# Patient Record
Sex: Female | Born: 1990 | Race: White | Hispanic: No | Marital: Married | Smoking: Never smoker
Health system: Southern US, Community
[De-identification: ages and names within clinical notes are randomized; demographics above are authoritative.]

## PROBLEM LIST (undated history)

## (undated) DIAGNOSIS — J45990 Exercise induced bronchospasm: Secondary | ICD-10-CM

## (undated) HISTORY — DX: Exercise induced bronchospasm: J45.990

---

## 2016-12-11 ENCOUNTER — Encounter: Payer: Self-pay | Admitting: Medical

## 2016-12-11 ENCOUNTER — Ambulatory Visit: Payer: Self-pay | Admitting: Medical

## 2016-12-11 VITALS — BP 102/60 | HR 89 | Temp 99.2°F | Resp 16 | Ht 63.0 in | Wt 195.0 lb

## 2016-12-11 DIAGNOSIS — R059 Cough, unspecified: Secondary | ICD-10-CM

## 2016-12-11 DIAGNOSIS — J069 Acute upper respiratory infection, unspecified: Secondary | ICD-10-CM

## 2016-12-11 DIAGNOSIS — R05 Cough: Secondary | ICD-10-CM

## 2016-12-11 DIAGNOSIS — J0100 Acute maxillary sinusitis, unspecified: Secondary | ICD-10-CM

## 2016-12-11 MED ORDER — AZITHROMYCIN 250 MG PO TABS: ORAL_TABLET | ORAL | 0 refills | Status: DC

## 2016-12-11 MED ORDER — BENZONATATE 100 MG PO CAPS: 100.0000 mg | ORAL_CAPSULE | Freq: Three times a day (TID) | ORAL | 0 refills | Status: DC | PRN

## 2016-12-11 MED ORDER — ALBUTEROL SULFATE HFA 108 (90 BASE) MCG/ACT IN AERS: 2.0000 | INHALATION_SPRAY | Freq: Four times a day (QID) | RESPIRATORY_TRACT | 2 refills | Status: DC | PRN

## 2016-12-11 NOTE — Patient Instructions (Signed)
Upper respiratory infecation,  Sinusitis cough Azithromycin  250mg  two tablets  By mouth day 1, then one tablet days 2-5, take with food. Benzonatate 1-2 tablets by mouth every  8 hours as needed for cough.  Albuterol MDI  2 puff by mouth every 6 hours as needed for cough, shortness of breath or wheezing. Return to clinic in3 -5 days if not improving

## 2016-12-11 NOTE — Progress Notes (Signed)
   Subjective:    Patient ID: Kristin Edwards, Kristin Edwards    DOB: 10/08/1990, [age] y.o.   MRN: [id number]  HPI  26 yo Kristin Edwards with 10 days of not feeling well. Initially sore throat and then cough, productive dark yellow. Nasal congestion and discharge thats yellow in color .Sore throat better now. No fever or chills.   Review of Systems  Constitutional: Negative for chills, fatigue and fever.  HENT: Positive for congestion, postnasal drip, rhinorrhea and sinus pressure. Negative for ear pain, sinus pain and sore throat.   Eyes: Negative for discharge and itching.  Respiratory: Positive for shortness of breath and wheezing. Negative for cough.   Cardiovascular: Negative for chest pain.  Gastrointestinal: Negative for abdominal distention.  Endocrine: Negative for polydipsia, polyphagia and polyuria.  Genitourinary: Negative for hematuria.  Musculoskeletal: Negative for myalgias.  Skin: Negative for rash.  Allergic/Immunologic: Positive for environmental allergies. Negative for food allergies.  Neurological: Negative for syncope.  Hematological: Does not bruise/bleed easily.  Psychiatric/Behavioral: Negative for confusion and hallucinations.   Sore throat with coughing Cough noted in room    Objective:   Physical Exam  Constitutional: She is oriented to person, place, and time. Vital signs are normal. She appears well-developed and well-nourished.  HENT:  Head: Normocephalic and atraumatic.  Right Ear: Hearing, tympanic membrane, external ear and ear canal normal.  Left Ear: Hearing, tympanic membrane, external ear and ear canal normal.  Nose: Mucosal edema present.  Mouth/Throat: Uvula is midline, oropharynx is clear and moist and mucous membranes are normal.  Eyes: Conjunctivae and EOM are normal. Pupils are equal, round, and reactive to light.  Neck: Normal range of motion. Neck supple.  Cardiovascular: Normal rate, regular rhythm and normal heart sounds.  Exam reveals no gallop and  no friction rub.   No murmur heard. Musculoskeletal: Normal range of motion.  Neurological: She is alert and oriented to person, place, and time.  Skin: Skin is warm and dry.  Psychiatric: She has a normal mood and affect. Her behavior is normal.  Nursing note and vitals reviewed.         Assessment & Plan:  Upper respiratory infection,  Sinusitis,  Cough Azithromycin  250mg  two tablets take 2 tablets  by mouth day 1, then one tablet days 2-5, take with food.#6 no refills. Benzonatate 1-2 tablets by mouth every  8 hours as needed for cough. #30 no refills Albuterol MDI  2 puff by mouth every 6 hours as needed for cough, shortness of breath or wheezing. Return to clinic in 3 -5 days if not improving Patient denies pregnancy

## 2016-12-12 DIAGNOSIS — G43909 Migraine, unspecified, not intractable, without status migrainosus: Secondary | ICD-10-CM | POA: Insufficient documentation

## 2016-12-12 DIAGNOSIS — J45909 Unspecified asthma, uncomplicated: Secondary | ICD-10-CM | POA: Insufficient documentation

## 2016-12-19 ENCOUNTER — Ambulatory Visit (INDEPENDENT_AMBULATORY_CARE_PROVIDER_SITE_OTHER): Payer: BLUE CROSS/BLUE SHIELD | Admitting: Obstetrics and Gynecology

## 2016-12-19 ENCOUNTER — Encounter: Payer: Self-pay | Admitting: Obstetrics and Gynecology

## 2016-12-19 VITALS — BP 138/82 | HR 111 | Ht 65.0 in | Wt 192.4 lb

## 2016-12-19 DIAGNOSIS — Z01419 Encounter for gynecological examination (general) (routine) without abnormal findings: Secondary | ICD-10-CM | POA: Diagnosis not present

## 2016-12-19 DIAGNOSIS — E669 Obesity, unspecified: Secondary | ICD-10-CM

## 2016-12-19 DIAGNOSIS — Z30431 Encounter for routine checking of intrauterine contraceptive device: Secondary | ICD-10-CM

## 2016-12-19 MED ORDER — NORETHIN ACE-ETH ESTRAD-FE 1-20 MG-MCG(24) PO TABS: 1.0000 | ORAL_TABLET | Freq: Every day | ORAL | 6 refills | Status: DC

## 2016-12-19 NOTE — Patient Instructions (Signed)
Place annual gynecologic exam patient instructions here.

## 2016-12-19 NOTE — Progress Notes (Signed)
ANNUAL PREVENTATIVE CARE GYN  ENCOUNTER NOTE  Subjective:       Kristin Edwards is a 26 y.o. G0P0000 female here for a routine annual gynecologic exam.  Current complaints: 1.  Spotting a few days every 21-23 days for last year    Gynecologic History No LMP recorded. Patient is not currently having periods (Reason: IUD). Contraception: IUD placed 10/2013 Last Pap: 2017. Results were: normal   Obstetric History OB History  Gravida Para Term Preterm AB Living  0 0 0 0 0 0  SAB TAB Ectopic Multiple Live Births  0 0 0 0 0        Past Medical History:  Diagnosis Date  . Exercise-induced asthma     History reviewed. No pertinent surgical history.  Current Outpatient Prescriptions on File Prior to Visit  Medication Sig Dispense Refill  . albuterol (PROVENTIL HFA;VENTOLIN HFA) 108 (90 Base) MCG/ACT inhaler Inhale 2 puffs into the lungs every 6 (six) hours as needed for wheezing or shortness of breath. Please give ventolin 1 Inhaler 2  . levonorgestrel (MIRENA) 20 MCG/24HR IUD 20 Intra Uterine Devices by Intrauterine route once.    Marland Kitchen. azithromycin (ZITHROMAX) 250 MG tablet Take 2 tablet by mouth day 1 then one tablet days 2-5 , take with food. (Patient not taking: Reported on 12/19/2016) 6 tablet 0  . cetirizine (ZYRTEC) 10 MG tablet Take 10 mg by mouth as needed.     No current facility-administered medications on file prior to visit.     Allergies  Allergen Reactions  . Penicillins Hives    Social History   Social History  . Marital status: Single    Spouse name: N/A  . Number of children: N/A  . Years of education: N/A   Occupational History  . Not on file.   Social History Main Topics  . Smoking status: Never Smoker  . Smokeless tobacco: Never Used  . Alcohol use Yes     Comment: occas  . Drug use: No  . Sexual activity: Yes    Birth control/ protection: IUD     Comment: mirena   Other Topics Concern  . Not on file   Social History Narrative  . No  narrative on file    Family History  Problem Relation Age of Onset  . Heart attack Maternal Grandfather   . Breast cancer Paternal Grandmother     The following portions of the patient's history were reviewed and updated as appropriate: allergies, current medications, past family history, past medical history, past social history, past surgical history and problem list.  Review of Systems ROS Review of Systems - General ROS: negative for - chills, fatigue, fever, hot flashes, night sweats, weight gain or weight loss Psychological ROS: negative for - anxiety, decreased libido, depression, mood swings, physical abuse or sexual abuse Ophthalmic ROS: negative for - blurry vision, eye pain or loss of vision ENT ROS: negative for - headaches, hearing change, visual changes or vocal changes Allergy and Immunology ROS: negative for - hives, itchy/watery eyes or seasonal allergies Hematological and Lymphatic ROS: negative for - bleeding problems, bruising, swollen lymph nodes or weight loss Endocrine ROS: negative for - galactorrhea, hair pattern changes, hot flashes, malaise/lethargy, mood swings, palpitations, polydipsia/polyuria, skin changes, temperature intolerance or unexpected weight changes Breast ROS: negative for - new or changing breast lumps or nipple discharge Respiratory ROS: negative for - cough or shortness of breath Cardiovascular ROS: negative for - chest pain, irregular heartbeat, palpitations or shortness of breath  Gastrointestinal ROS: no abdominal pain, change in bowel habits, or black or bloody stools Genito-Urinary ROS: no dysuria, trouble voiding, or hematuria Musculoskeletal ROS: negative for - joint pain or joint stiffness Neurological ROS: negative for - bowel and bladder control changes Dermatological ROS: negative for rash and skin lesion changes   Objective:   BP 138/82   Pulse (!) 111   Ht 5\' 5"  (1.651 m)   Wt 192 lb 6.4 oz (87.3 kg)   BMI 32.02 kg/m   CONSTITUTIONAL: Well-developed, well-nourished female in no acute distress.  PSYCHIATRIC: Normal mood and affect. Normal behavior. Normal judgment and thought content. NEUROLGIC: Alert and oriented to person, place, and time. Normal muscle tone coordination. No cranial nerve deficit noted. HENT:  Normocephalic, atraumatic, External right and left ear normal. Oropharynx is clear and moist EYES: Conjunctivae and EOM are normal. Pupils are equal, round, and reactive to light. No scleral icterus.  NECK: Normal range of motion, supple, no masses.  Normal thyroid.  SKIN: Skin is warm and dry. No rash noted. Not diaphoretic. No erythema. No pallor. CARDIOVASCULAR: Normal heart rate noted, regular rhythm, no murmur. RESPIRATORY: Clear to auscultation bilaterally. Effort and breath sounds normal, no problems with respiration noted. BREASTS: Symmetric in size. No masses, skin changes, nipple drainage, or lymphadenopathy. ABDOMEN: Soft, normal bowel sounds, no distention noted.  No tenderness, rebound or guarding.  BLADDER: Normal PELVIC:  External Genitalia: Normal  BUS: Normal  Vagina: Normal  Cervix: Normal  Uterus: Normal  Adnexa: Normal  RV: deferred  MUSCULOSKELETAL: Normal range of motion. No tenderness.  No cyanosis, clubbing, or edema.  2+ distal pulses. LYMPHATIC: No Axillary, Supraclavicular, or Inguinal Adenopathy.    Assessment:   Annual gynecologic examination 26 y.o. Contraception: IUD Obesity 1 Problem List Items Addressed This Visit    None    Visit Diagnoses    Encounter for gynecological examination without abnormal finding    -  Primary      Plan:  Pap: Not needed Mammogram: Not Indicated Stool Guaiac Testing:  Not Indicated Labs: comprehensive metabolic, Lipid 1, TSH and Hemoglobin A1C Routine preventative health maintenance measures emphasized: Exercise/Diet/Weight control and Stress Management  Return to Clinic - 1 Year   Melody Suzan Nailer, CNM

## 2016-12-20 LAB — COMPREHENSIVE METABOLIC PANEL
A/G RATIO: 1.5 (ref 1.2–2.2)
ALBUMIN: 4.6 g/dL (ref 3.5–5.5)
ALT: 17 IU/L (ref 0–32)
AST: 23 IU/L (ref 0–40)
Alkaline Phosphatase: 65 IU/L (ref 39–117)
BILIRUBIN TOTAL: 0.3 mg/dL (ref 0.0–1.2)
BUN / CREAT RATIO: 18 (ref 9–23)
BUN: 13 mg/dL (ref 6–20)
CHLORIDE: 104 mmol/L (ref 96–106)
CO2: 23 mmol/L (ref 20–29)
Calcium: 9.5 mg/dL (ref 8.7–10.2)
Creatinine, Ser: 0.73 mg/dL (ref 0.57–1.00)
GFR calc Af Amer: 131 mL/min/{1.73_m2} (ref >59)
GFR calc non Af Amer: 114 mL/min/{1.73_m2} (ref >59)
Globulin, Total: 3.1 g/dL (ref 1.5–4.5)
Glucose: 84 mg/dL (ref 65–99)
POTASSIUM: 4.5 mmol/L (ref 3.5–5.2)
Sodium: 144 mmol/L (ref 134–144)
Total Protein: 7.7 g/dL (ref 6.0–8.5)

## 2016-12-20 LAB — TSH: TSH: 3.5 u[IU]/mL (ref 0.450–4.500)

## 2016-12-20 LAB — LIPID PANEL
CHOLESTEROL TOTAL: 148 mg/dL (ref 100–199)
Chol/HDL Ratio: 3.3 ratio (ref 0.0–4.4)
HDL: 45 mg/dL (ref >39)
LDL CALC: 91 mg/dL (ref 0–99)
Triglycerides: 60 mg/dL (ref 0–149)
VLDL CHOLESTEROL CAL: 12 mg/dL (ref 5–40)

## 2016-12-20 LAB — HEMOGLOBIN A1C
Est. average glucose Bld gHb Est-mCnc: 94 mg/dL
Hgb A1c MFr Bld: 4.9 % (ref 4.8–5.6)

## 2017-11-05 ENCOUNTER — Ambulatory Visit
Admission: RE | Admit: 2017-11-05 | Discharge: 2017-11-05 | Disposition: A | Discharge status: Home / Self Care | Payer: BLUE CROSS/BLUE SHIELD | Source: Ambulatory Visit | Attending: Adult Health | Admitting: Adult Health

## 2017-11-05 ENCOUNTER — Other Ambulatory Visit: Admission: RE | Admit: 2017-11-05 | Payer: BLUE CROSS/BLUE SHIELD | Source: Ambulatory Visit | Admitting: *Deleted

## 2017-11-05 ENCOUNTER — Ambulatory Visit: Payer: Self-pay | Admitting: Adult Health

## 2017-11-05 VITALS — BP 127/83 | HR 101 | Temp 99.1°F | Resp 16 | Ht 66.0 in | Wt 206.4 lb

## 2017-11-05 DIAGNOSIS — Z0000 Encounter for general adult medical examination without abnormal findings: Secondary | ICD-10-CM

## 2017-11-05 DIAGNOSIS — M79662 Pain in left lower leg: Secondary | ICD-10-CM

## 2017-11-05 NOTE — Progress Notes (Addendum)
Subjective:     Patient ID: Kristin Edwards, female   DOB: 03/23/1991, 27 y.o.   MRN: [id number]  HPI   Blood pressure 127/83, pulse (!) 101, temperature 99.1 F (37.3 C), temperature source Tympanic, resp. rate 16, height  (1.676 m), weight 206 lb 6.4 oz (93.6 kg), SpO2 100 %.  Patient is a 27 year old female in no acute distress who comes in with complaints of left mid to lower  shin " bump " area   - she reports she noticed this area while she was watching tv. She reports previous shin splints in past to left and right lower leg. Onset unknown.  She reports many sports injuries - such as ankle sprain and left foot break in past. She also moved on April 1st and also remebers bumping herself many times but no significant to that area.   Denies any other areas of concerns. Also denies any gait abnormality.Denies any recent trauma or injury.   She reports she has recently started an intense work out routine in the past three weeks including many push ups and burpee- she noticed this area in her left lower leg after starting this exercise routine.  Tender if she " pushes" on area and  Denies any pain otherwise.    Denies any skin cancer history personal or family.  Denies any cancer history in family.  She denied any chronic medical problems and reports she is using only Flonase daily.  Review of Systems  Constitutional: Negative.   HENT: Negative.   Respiratory: Negative.   Cardiovascular: Negative.   Gastrointestinal: Negative.   Endocrine: Negative.   Genitourinary: Negative.   Musculoskeletal:       Left lower leg " BUMPY " area   Allergic/Immunologic: Negative.   Neurological: Negative.   Hematological: Negative.   Psychiatric/Behavioral: Negative.        Objective:   Physical Exam  Constitutional: She is oriented to person, place, and time. She appears well-developed and well-nourished. No distress.  HENT:  Head: Normocephalic and atraumatic.  Nose: Nose normal.    Mouth/Throat: No oropharyngeal exudate.  Eyes: Pupils are equal, round, and reactive to light. Conjunctivae and EOM are normal. Right eye exhibits no discharge. Left eye exhibits no discharge. No scleral icterus.  Neck: Normal range of motion. Neck supple.  Cardiovascular: Normal rate, regular rhythm, normal heart sounds and intact distal pulses. Exam reveals no gallop and no friction rub.  No murmur heard. Pulmonary/Chest: Effort normal and breath sounds normal. No stridor. No respiratory distress. She has no wheezes. She exhibits no tenderness.  Abdominal: Soft. Bowel sounds are normal. She exhibits no distension and no mass. There is no tenderness. There is no rebound and no guarding. No hernia.  Musculoskeletal: Normal range of motion.       Left knee: She exhibits normal range of motion, no swelling, no effusion, no ecchymosis, no deformity, no laceration, no erythema, normal alignment, no LCL laxity, normal patellar mobility, no bony tenderness, normal meniscus and no MCL laxity. Tenderness: tenderness - " very mild" with deep  palpation over area marked / no significant abnormality of anatomy or skin noted on exam  No medial joint line, no lateral joint line, no MCL, no LCL and no patellar tendon tenderness noted.       Legs:      Right foot: There is normal range of motion and no deformity.       Left foot: There is normal range of motion and  no deformity.  Feet:  Right Foot:  Skin Integrity: Negative for ulcer, blister, skin breakdown, erythema, warmth, callus or dry skin.  Left Foot:  Skin Integrity: Negative for ulcer, blister, skin breakdown, erythema, warmth, callus or dry skin.  Lymphadenopathy:       Head (right side): No submental, no submandibular, no tonsillar, no preauricular, no posterior auricular and no occipital adenopathy present.       Head (left side): No submental, no submandibular, no tonsillar, no preauricular, no posterior auricular and no occipital adenopathy  present.       Right cervical: No superficial cervical, no deep cervical and no posterior cervical adenopathy present.      Left cervical: No superficial cervical, no deep cervical and no posterior cervical adenopathy present.  Neurological: She is alert and oriented to person, place, and time. She has normal strength and normal reflexes.  Skin: Skin is warm and dry. Capillary refill takes less than 2 seconds. No abrasion, no bruising, no burn, no ecchymosis, no laceration, no lesion, no petechiae and no rash noted. She is not diaphoretic. No cyanosis or erythema. No pallor. Nails show no clubbing.  Psychiatric: She has a normal mood and affect. Her speech is normal and behavior is normal. Thought content normal.  Nursing note and vitals reviewed.      Assessment:     Routine adult health maintenance - Plan: CBC w/Diff, DG Tibia/Fibula Left  Pain in left shin - Plan: DG Tibia/Fibula Left      Plan:     Motrin 600 mg every eight hours as needed for pain and inflammation x 6 days.   Will x ray left lower leg given previous injuries and new work out routine.  Will also check CBC as she has not had done with labs from 2018.  Orders Placed This Encounter  Procedures  . DG Tibia/Fibula Left    Standing Status:   Future    Standing Expiration Date:   02/05/2018    Order Specific Question:   Reason for Exam (SYMPTOM  OR DIAGNOSIS REQUIRED)    Answer:   shin pain / " knotty feeling" / multiple previous sports injuries    Order Specific Question:   Is patient pregnant?    Answer:   No    Order Specific Question:   Preferred imaging location?    Answer:   [facility]    Order Specific Question:   Call Results- Best Contact Number?    Answer:   [phone]    Order Specific Question:   Radiology Contrast Protocol - do NOT remove file path    Answer:   [email or web address]  . CBC w/Diff   Follow up in two weeks. Will call with x ray result and labs   if no call within one week patient will call.   Advised patient call the office or your primary care doctor for an appointment if no improvement within 72 hours or if any symptoms change or worsen at any time  Advised ER or urgent Care if after hours or on weekend. Call 911 for emergency symptoms at any time.Patinet verbalized understanding of all instructions given/reviewed and treatment plan and has no further questions or concerns at this time.

## 2017-11-05 NOTE — Patient Instructions (Addendum)
Shin Splints Rehab Ask your health care provider which exercises are safe for you. Do exercises exactly as told by your health care provider and adjust them as directed. It is normal to feel mild stretching, pulling, tightness, or discomfort as you do these exercises, but you should stop right away if you feel sudden pain or your pain gets worse.Do not begin these exercises until told by your health care provider. Stretching and range of motion exercise This exercise warms up your muscles and joints and improves the movement and flexibility of your lower leg. This exercise also helps to relieve pain, numbness, and tingling. Exercise A: Calf stretch, standing  1. Stand with the ball of your left / right foot on a step. The ball of your foot is on the walking surface, right under your toes. 2. Keep your other foot firmly on the same step. 3. Hold onto the wall, a railing, or a chair for balance. 4. Slowly lift your other foot, allowing your body weight to press your left / right heel down over the edge of the step. You should feel a stretch in your left / right calf. 5. Hold this position for __________ seconds. 6. Repeat this exercise with a slight bend in your left / right knee. Repeat __________ times with your left / right knee straight and __________ times with your left / right knee bent. Complete this exercise __________ times a day. Strengthening exercises These exercises build strength and endurance in your lower leg. Endurance is the ability to use your muscles for a long time, even after they get tired. Exercise B: Dorsiflexion  1. Secure a rubber exercise band or tubing to a fixed object, such as a table leg or a pole. 2. Secure the other end of the band around your left / right foot. 3. Sit on the floor, facing the fixed object. The band should be slightly tense when your foot is relaxed. 4. Slowly use your ankle muscles to pull your foot toward you. 5. Hold this position for  __________ seconds. 6. Slowly release the tension in the band and return your foot to the starting position. Repeat __________ times. Complete this exercise __________ times a day. Exercise C: Ankle eversion with band 1. Secure one end of a rubber exercise band or tubing to a fixed object, such as a table leg or a pole, that will stay in place when the band is pulled. 2. Loop the other end of the band around the middle of your left / right foot. 3. Sit on the floor, facing the fixed object. The band should be slightly tense when your foot is relaxed. 4. Make fists with your hands and put them between your knees. This will focus your strengthening at your ankle. 5. Leading with your little toe, slowly push your banded foot outward, away from your other leg. Make sure the band is positioned to resist the entire motion. 6. Hold this position for __________ seconds. 7. Control the tension in the band as you slowly return your foot to the starting position. Repeat __________ times. Complete this exercise __________ times a day. Exercise D: Ankle inversion with band 1. Secure one end of a rubber exercise band or tubing to a fixed object, such as a table leg or a pole, that will stay still when the band is pulled. 2. Loop the other end of the band around your left / right foot, just below your toes. 3. Sit on the floor, facing the fixed  object. The band should be slightly tense when your foot is relaxed. 4. Make fists with your hands and put them between your knees. This will focus your strengthening at your ankle. 5. Leading with your big toe, slowly pull your banded foot inward, toward your other leg. Make sure the band is positioned to resist the entire motion. 6. Hold this position for __________ seconds. 7. Control the tension in the band as you slowly return your foot to the starting position. Repeat __________ times. Complete this exercise __________ times a day. Exercise E: Lateral walking with  band 1. Stand in a long hallway. 2. Wrap a loop of exercise band around your legs, just above your knees. 3. Bend your knees gently and drop your hips down and back so your weight is over your heels. 4. Step to the side to move down the length of the hallway, keeping your toes pointed forward and keeping tension in the band. 5. Repeat, leading with your other leg. Repeat __________ times. Complete this exercise __________ times a day. Balance exercise This exercise will help improve your control of your foot and ankle when you are standing or walking. Exercise F: Single leg stand 1. Without wearing shoes, stand near a railing or in a doorway. You may hold onto the railing or door frame as needed. 2. Stand on your left / right foot. Keep your big toe down on the floor and try to keep your arch lifted. 3. If this exercise is too easy, you can try doing it with your eyes closed or while standing on a pillow. 4. Hold this position for __________ seconds. Repeat __________ times. Complete this exercise __________ times a day. This information is not intended to replace advice given to you by your health care provider. Make sure you discuss any questions you have with your health care provider. Document Released: 06/19/2005 Document Revised: 02/22/2016 Document Reviewed: 03/18/2015 Elsevier Interactive Patient Education  [employer].

## 2017-11-06 ENCOUNTER — Telehealth: Payer: Self-pay | Admitting: Adult Health

## 2017-11-06 ENCOUNTER — Encounter: Payer: Self-pay | Admitting: Adult Health

## 2017-11-06 DIAGNOSIS — Z0000 Encounter for general adult medical examination without abnormal findings: Secondary | ICD-10-CM

## 2017-11-06 LAB — CBC WITH DIFFERENTIAL/PLATELET
BASOS: 0 %
Basophils Absolute: 0 10*3/uL (ref 0.0–0.2)
EOS (ABSOLUTE): 0.1 10*3/uL (ref 0.0–0.4)
Eos: 1 %
Hematocrit: 43.1 % (ref 34.0–46.6)
Hemoglobin: 14.2 g/dL (ref 11.1–15.9)
IMMATURE GRANS (ABS): 0 10*3/uL (ref 0.0–0.1)
IMMATURE GRANULOCYTES: 0 %
LYMPHS: 27 %
Lymphocytes Absolute: 1.9 10*3/uL (ref 0.7–3.1)
MCH: 30.1 pg (ref 26.6–33.0)
MCHC: 32.9 g/dL (ref 31.5–35.7)
MCV: 92 fL (ref 79–97)
Monocytes Absolute: 0.5 10*3/uL (ref 0.1–0.9)
Monocytes: 7 %
NEUTROS PCT: 65 %
Neutrophils Absolute: 4.6 10*3/uL (ref 1.4–7.0)
PLATELETS: 421 10*3/uL — AB (ref 150–379)
RBC: 4.71 x10E6/uL (ref 3.77–5.28)
RDW: 13.2 % (ref 12.3–15.4)
WBC: 7.1 10*3/uL (ref 3.4–10.8)

## 2017-11-06 NOTE — Progress Notes (Signed)
Reviewed normal x ray left tibial/ fibula with patient.

## 2017-11-06 NOTE — Telephone Encounter (Signed)
Patient was called 11/06/17 10:15am  with lab results as below- Platelets mildly  Elevated, patient is informed she will need repeat lab draw to recheck platelets in one month and a follow up appointment in 1- 2 weeks for recheck on leg. She reports her leg is a little more painful  Today- though she reports she did a intense workout last night. She is advised again to rest leg for one to two weeks and not manipulate the area multiple times with hand. She will do Apply a compressive ACE bandage. Rest and elevate the affected painful area.  Apply cold compresses intermittently as needed.  As pain recedes, begin normal activities slowly as tolerated.  Call if symptoms persist. She is also advised of [facility] in [place]- walk in clinic hours Monday to Friday - 1 pm to 7 pm. She is advised she may go for evaluation there if after hours or she feels symptoms worsening. Continue care plan from office visit, avoid exercise at this time and follow up evaluation 1-2 weeks.  Orders Placed This Encounter  Procedures  . CBC w/Diff    Standing Status:   Future    Standing Expiration Date:   01/06/2018  . Comprehensive metabolic panel    Standing Status:   Future    Standing Expiration Date:   01/06/2018  I have already ordered this as a future order in the computer.  Advised patient call the office or your primary care doctor for an appointment if no improvement within 72 hours or if any symptoms change or worsen at any time  Advised ER or urgent Care if after hours or on weekend. Call 911 for emergency symptoms at any time.Patinet verbalized understanding of all instructions given/reviewed and treatment plan and has no further questions or concerns at this time.    Patient verbalized understanding of all instructions given and denies any further questions at this time.    Results for orders placed or performed in visit on 11/05/17 (from the past 24 hour(s))  CBC w/Diff     Status: Abnormal   Collection  Time: 11/05/17 10:13 AM  Result Value Ref Range   WBC 7.1 3.4 - 10.8 x10E3/uL   RBC 4.71 3.77 - 5.28 x10E6/uL   Hemoglobin 14.2 11.1 - 15.9 g/dL   Hematocrit 16.1 09.6 - 46.6 %   MCV 92 79 - 97 fL   MCH 30.1 26.6 - 33.0 pg   MCHC 32.9 31.5 - 35.7 g/dL   RDW 04.5 40.9 - 81.1 %   Platelets 421 (H) 150 - 379 x10E3/uL   Neutrophils 65 Not Estab. %   Lymphs 27 Not Estab. %   Monocytes 7 Not Estab. %   Eos 1 Not Estab. %   Basos 0 Not Estab. %   Neutrophils Absolute 4.6 1.4 - 7.0 x10E3/uL   Lymphocytes Absolute 1.9 0.7 - 3.1 x10E3/uL   Monocytes Absolute 0.5 0.1 - 0.9 x10E3/uL   EOS (ABSOLUTE) 0.1 0.0 - 0.4 x10E3/uL   Basophils Absolute 0.0 0.0 - 0.2 x10E3/uL   Immature Granulocytes 0 Not Estab. %   Immature Grans (Abs) 0.0 0.0 - 0.1 x10E3/uL   Narrative   Performed at:  [address], [place], [place]  [id number] Lab Director: Jolene Schimke MD, Phone:  [phone]   CLINICAL DATA:  The patient noticed a bulla over the lower lobe left leg. History of occasional pain here. Duration of the finding several months. History of previous sports injuries.  EXAM:  LEFT TIBIA AND FIBULA - 2 VIEW  COMPARISON:  None in PACs  FINDINGS: The site of the cutaneous finding is not noted on the images. The tibia and fibula are subjectively adequately mineralized and are intact. The observed portions of the knee and ankle are normal. There is no lytic or blastic bony lesion or periosteal reaction. The pretibial soft tissues are fairly uniform in thickness. No discrete soft tissue mass is observed. The soft tissues elsewhere exhibit no suspicious findings.  IMPRESSION: No radiographic abnormalities are observed.   Electronically Signed   By: David  [place] M.D.   On: 11/05/2017 15:57

## 2017-11-13 ENCOUNTER — Other Ambulatory Visit: Payer: Self-pay | Admitting: Obstetrics and Gynecology

## 2017-11-20 ENCOUNTER — Encounter: Payer: Self-pay | Admitting: Medical

## 2017-11-20 ENCOUNTER — Ambulatory Visit: Payer: Self-pay | Admitting: Medical

## 2017-11-20 VITALS — BP 136/73 | HR 77 | Temp 99.7°F | Wt 207.8 lb

## 2017-11-20 DIAGNOSIS — M7989 Other specified soft tissue disorders: Secondary | ICD-10-CM

## 2017-11-20 NOTE — Progress Notes (Signed)
   Subjective:    Patient ID: Kristin Edwards, female    DOB: Oct 28, 1990, 27 y.o.   MRN: [id number]  HPI 27 yo female  non acute distess. Noticed that she has had this bump  it  5-6 years ago in  old photographs.  She states she was all "freaked out" by the internet and her grandmother and mother about the area and thought she may have bone cancer.  She did soccer and competitive tennis  Iin college.She also started a five weeks ago a new training program called.[employer] bikini body exercise program. The  leg does not hurt. She had an xray of the area and blood work on 11/05/17 and saw Marvell Fuller NP in the clinic.She was advised to take  Advil 600 mg every 8 hours but she forgot more then she remembered because it really did not hurt. She also  Iced and ace wrapped faithfully, then wore a compression sock, both did nothing for the swelling..  She states she misses exercise and would like to return to her routine..  Review of Systems  Constitutional: Negative for chills and fever.  HENT: Positive for postnasal drip. Negative for congestion and sore throat.   Eyes: Negative for discharge and itching.  Respiratory: Positive for cough (phelgm in the morning).   Cardiovascular: Negative for chest pain.  Gastrointestinal: Negative for abdominal pain.  Endocrine: Negative for polydipsia, polyphagia and polyuria.  Genitourinary: Negative for dysuria.  Musculoskeletal: Negative for myalgias.  Skin: Negative for rash.  Allergic/Immunologic: Positive for environmental allergies. Negative for food allergies.  Neurological: Positive for headaches. Negative for dizziness and light-headedness.  Hematological: Negative for adenopathy.  Psychiatric/Behavioral: Negative for behavioral problems, self-injury and suicidal ideas.       Objective:   Physical Exam  Constitutional: She is oriented to person, place, and time. She appears well-developed and well-nourished.  HENT:  Head: Normocephalic and  atraumatic.  Eyes: Pupils are equal, round, and reactive to light. Conjunctivae and EOM are normal.  Musculoskeletal: She exhibits edema (left lower anterior leg).  Neurological: She is alert and oriented to person, place, and time.  Skin: Skin is warm and dry. Capillary refill takes less than 2 seconds. No rash noted. No erythema. No pallor.  Psychiatric: She has a normal mood and affect. Her behavior is normal. Judgment and thought content normal.  Nursing note and vitals reviewed.       Noted area on  Left lower anterior leg on palpation approximately 7 cm x  4 cm, non tender . Skin tissue looks healthy and pink  <2 s cr.    Assessment & Plan:   Swelling on left lower anterior leg. Could be old scar tissue. May want to refer her to orthopedics if not improved in 2 months.  Elevated platelets Reviewed with patient. CBC with platelet recheck  1-2 months. recheck in 2 months of leg swelling. per Dr. Sullivan Lone with family doctor or here. Reviewed plan with Marvell Fuller NP and she was in agreement. She says she is travel to [place] in June for 11 days, and that she will come by for blood work after she returns. She verbalizes understanding and has no questions at discharge.

## 2017-12-25 ENCOUNTER — Ambulatory Visit (INDEPENDENT_AMBULATORY_CARE_PROVIDER_SITE_OTHER): Payer: BLUE CROSS/BLUE SHIELD | Admitting: Certified Nurse Midwife

## 2017-12-25 ENCOUNTER — Encounter: Payer: BLUE CROSS/BLUE SHIELD | Admitting: Obstetrics and Gynecology

## 2017-12-25 VITALS — BP 136/75 | HR 83 | Ht 66.0 in | Wt 211.2 lb

## 2017-12-25 DIAGNOSIS — Z0000 Encounter for general adult medical examination without abnormal findings: Secondary | ICD-10-CM

## 2017-12-25 NOTE — Patient Instructions (Signed)
Preventive Care 18-39 Years, Female Preventive care refers to lifestyle choices and visits with your health care provider that can promote health and wellness. What does preventive care include?  A yearly physical exam. This is also called an annual well check.  Dental exams once or twice a year.  Routine eye exams. Ask your health care provider how often you should have your eyes checked.  Personal lifestyle choices, including: ? Daily care of your teeth and gums. ? Regular physical activity. ? Eating a healthy diet. ? Avoiding tobacco and drug use. ? Limiting alcohol use. ? Practicing safe sex. ? Taking vitamin and mineral supplements as recommended by your health care provider. What happens during an annual well check? The services and screenings done by your health care provider during your annual well check will depend on your age, overall health, lifestyle risk factors, and family history of disease. Counseling Your health care provider may ask you questions about your:  Alcohol use.  Tobacco use.  Drug use.  Emotional well-being.  Home and relationship well-being.  Sexual activity.  Eating habits.  Work and work Statistician.  Method of birth control.  Menstrual cycle.  Pregnancy history.  Screening You may have the following tests or measurements:  Height, weight, and BMI.  Diabetes screening. This is done by checking your blood sugar (glucose) after you have not eaten for a while (fasting).  Blood pressure.  Lipid and cholesterol levels. These may be checked every 5 years starting at age 66.  Skin check.  Hepatitis C blood test.  Hepatitis B blood test.  Sexually transmitted disease (STD) testing.  BRCA-related cancer screening. This may be done if you have a family history of breast, ovarian, tubal, or peritoneal cancers.  Pelvic exam and Pap test. This may be done every 3 years starting at age 40. Starting at age 59, this may be done every 5  years if you have a Pap test in combination with an HPV test.  Discuss your test results, treatment options, and if necessary, the need for more tests with your health care provider. Vaccines Your health care provider may recommend certain vaccines, such as:  Influenza vaccine. This is recommended every year.  Tetanus, diphtheria, and acellular pertussis (Tdap, Td) vaccine. You may need a Td booster every 10 years.  Varicella vaccine. You may need this if you have not been vaccinated.  HPV vaccine. If you are 27 or younger, you may need three doses over 6 months.  Measles, mumps, and rubella (MMR) vaccine. You may need at least one dose of MMR. You may also need a second dose.  Pneumococcal 13-valent conjugate (PCV13) vaccine. You may need this if you have certain conditions and were not previously vaccinated.  Pneumococcal polysaccharide (PPSV23) vaccine. You may need one or two doses if you smoke cigarettes or if you have certain conditions.  Meningococcal vaccine. One dose is recommended if you are age 27-27 years and a first-year college student living in a residence hall, or if you have one of several medical conditions. You may also need additional booster doses.  Hepatitis A vaccine. You may need this if you have certain conditions or if you travel or work in places where you may be exposed to hepatitis A.  Hepatitis B vaccine. You may need this if you have certain conditions or if you travel or work in places where you may be exposed to hepatitis B.  Haemophilus influenzae type b (Hib) vaccine. You may need this if  you have certain risk factors.  Talk to your health care provider about which screenings and vaccines you need and how often you need them. This information is not intended to replace advice given to you by your health care provider. Make sure you discuss any questions you have with your health care provider. Document Released: 08/15/2001 Document Revised: 03/08/2016  Document Reviewed: 04/20/2015 Elsevier Interactive Patient Education  [employer].

## 2017-12-25 NOTE — Progress Notes (Signed)
GYNECOLOGY ANNUAL PREVENTATIVE CARE ENCOUNTER NOTE  Subjective:   Kristin Edwards is a 27 y.o. G0P0000 female here for a routine annual gynecologic exam.  Current complaints: none.   Denies abnormal vaginal bleeding, discharge, pelvic pain, problems with intercourse or other gynecologic concerns.    Gynecologic History No LMP recorded. (Menstrual status: IUD). Contraception: IUD Last Pap: 2017 per pt Results were: normal  Last mammogram: N/A    Obstetric History OB History  Gravida Para Term Preterm AB Living  0 0 0 0 0 0  SAB TAB Ectopic Multiple Live Births  0 0 0 0 0    Past Medical History:  Diagnosis Date  . Exercise-induced asthma     No past surgical history on file.  Current Outpatient Medications on File Prior to Visit  Medication Sig Dispense Refill  . albuterol (PROVENTIL HFA;VENTOLIN HFA) 108 (90 Base) MCG/ACT inhaler Inhale 2 puffs into the lungs every 6 (six) hours as needed for wheezing or shortness of breath. Please give ventolin 1 Inhaler 2  . cetirizine (ZYRTEC) 10 MG tablet Take 10 mg by mouth as needed.    Marland Kitchen levonorgestrel (MIRENA) 20 MCG/24HR IUD 20 Intra Uterine Devices by Intrauterine route once.     No current facility-administered medications on file prior to visit.     Allergies  Allergen Reactions  . Penicillins Hives    Social History:  reports that she has never smoked. She has never used smokeless tobacco. She reports that she drinks alcohol. She reports that she does not use drugs.  Family History  Problem Relation Age of Onset  . Heart attack Maternal Grandfather   . Breast cancer Paternal Grandmother     The following portions of the patient's history were reviewed and updated as appropriate: allergies, current medications, past family history, past medical history, past social history, past surgical history and problem list.  Review of Systems Pertinent items noted in HPI and remainder of comprehensive ROS otherwise negative.    Objective:  There were no vitals taken for this visit. CONSTITUTIONAL: Well-developed, well-nourished obese female in no acute distress.  HENT:  Normocephalic, atraumatic, External right and left ear normal. Oropharynx is clear and moist EYES: Conjunctivae and EOM are normal. Pupils are equal, round, and reactive to light. No scleral icterus.  NECK: Normal range of motion, supple, no masses.  Normal thyroid.  SKIN: Skin is warm and dry. No rash noted. Not diaphoretic. No erythema. No pallor. MUSCULOSKELETAL: Normal range of motion. No tenderness.  No cyanosis, clubbing, or edema.  2+ distal pulses. NEUROLOGIC: Alert and oriented to person, place, and time. Normal reflexes, muscle tone coordination. No cranial nerve deficit noted. PSYCHIATRIC: Normal mood and affect. Normal behavior. Normal judgment and thought content. CARDIOVASCULAR: Normal heart rate noted, regular rhythm RESPIRATORY: Clear to auscultation bilaterally. Effort and breath sounds normal, no problems with respiration noted. BREASTS: Symmetric in size. No masses, skin changes, nipple drainage, or lymphadenopathy. ABDOMEN: Soft, normal bowel sounds, no distention noted.  No tenderness, rebound or guarding.  PELVIC: Normal appearing external genitalia; normal appearing vaginal mucosa and cervix.  No abnormal discharge noted.    Normal uterine size, no other palpable masses, no uterine or adnexal tenderness.    Assessment and Plan:  Annual Well Women Exam  Labs: TSH, CMP, Lipid panel, TSH done last year. No labs indicated. Pt request CBC she state she has a lump on her leg that she had for a while . She went to MD @ [facility] and had work  up . She a CBC that showed elevated plts. She was to have it repeated and asked if she could have it done here. Order placed.   Mammogram not indicated Routine preventative health maintenance measures emphasized. Please refer to After Visit Summary for other counseling recommendations.   Doreene BurkeAnnie  Earsel Shouse, CNM

## 2017-12-25 NOTE — Progress Notes (Signed)
Pt is here for an annual exam. Pt has a Mirena inserted 07/24/13.

## 2017-12-26 ENCOUNTER — Encounter: Payer: Self-pay | Admitting: Medical

## 2017-12-26 ENCOUNTER — Encounter (INDEPENDENT_AMBULATORY_CARE_PROVIDER_SITE_OTHER): Payer: Self-pay

## 2017-12-26 LAB — CBC
Hematocrit: 40.1 % (ref 34.0–46.6)
Hemoglobin: 13.6 g/dL (ref 11.1–15.9)
MCH: 29.8 pg (ref 26.6–33.0)
MCHC: 33.9 g/dL (ref 31.5–35.7)
MCV: 88 fL (ref 79–97)
PLATELETS: 421 10*3/uL (ref 150–450)
RBC: 4.57 x10E6/uL (ref 3.77–5.28)
RDW: 13.6 % (ref 12.3–15.4)
WBC: 8.7 10*3/uL (ref 3.4–10.8)

## 2018-01-02 ENCOUNTER — Ambulatory Visit: Payer: BLUE CROSS/BLUE SHIELD | Admitting: Obstetrics and Gynecology

## 2018-01-14 ENCOUNTER — Telehealth: Payer: Self-pay | Admitting: Obstetrics and Gynecology

## 2018-01-14 NOTE — Telephone Encounter (Signed)
The patient called and stated that she would like to speak with a nurse in regards to wanting to know how much Advil she can take before her appointment tomorrow. No other information was disclosed.  Please advise.

## 2018-01-14 NOTE — Telephone Encounter (Signed)
Notified pt. 

## 2018-01-15 ENCOUNTER — Other Ambulatory Visit (INDEPENDENT_AMBULATORY_CARE_PROVIDER_SITE_OTHER): Payer: BLUE CROSS/BLUE SHIELD

## 2018-01-15 ENCOUNTER — Encounter: Payer: Self-pay | Admitting: Obstetrics and Gynecology

## 2018-01-15 ENCOUNTER — Ambulatory Visit (INDEPENDENT_AMBULATORY_CARE_PROVIDER_SITE_OTHER): Payer: BLUE CROSS/BLUE SHIELD | Admitting: Obstetrics and Gynecology

## 2018-01-15 ENCOUNTER — Telehealth: Payer: Self-pay | Admitting: Obstetrics and Gynecology

## 2018-01-15 ENCOUNTER — Other Ambulatory Visit: Payer: Self-pay | Admitting: Obstetrics and Gynecology

## 2018-01-15 VITALS — BP 150/86 | HR 100 | Ht 66.0 in | Wt 210.9 lb

## 2018-01-15 DIAGNOSIS — Z30433 Encounter for removal and reinsertion of intrauterine contraceptive device: Secondary | ICD-10-CM

## 2018-01-15 DIAGNOSIS — Z30431 Encounter for routine checking of intrauterine contraceptive device: Secondary | ICD-10-CM

## 2018-01-15 MED ORDER — LEVOFLOXACIN 500 MG PO TABS: 500.0000 mg | ORAL_TABLET | Freq: Every day | ORAL | 0 refills | Status: DC

## 2018-01-15 MED ORDER — FLUCONAZOLE 150 MG PO TABS: 150.0000 mg | ORAL_TABLET | Freq: Once | ORAL | 3 refills | Status: AC

## 2018-01-15 MED ORDER — FLUCONAZOLE 150 MG PO TABS: 150.0000 mg | ORAL_TABLET | Freq: Once | ORAL | 3 refills | Status: DC

## 2018-01-15 NOTE — Telephone Encounter (Signed)
The patient called and stated that she would like her prescription to be sent [facility] the [facility] [place] and also that she will need a prescription for a yeast infection after taking her antibiotic because [place]. Please advise.

## 2018-01-15 NOTE — Telephone Encounter (Signed)
pls advise

## 2018-01-15 NOTE — Progress Notes (Signed)
Kristin CobbleVictoria Edwards is a 27 y.o. year old 740P0000 Caucasian female who presents for removal and replacement of a Mirena IUD. She was given informed consent for removal and reinsertion of her Mirena. Her Mirena was placed 2015, No LMP recorded. (Menstrual status: IUD)., and her pregnancy test today was negative.   The risks and benefits of the method and placement have been thouroughly reviewed with the patient and all questions were answered.  Specifically the patient is aware of failure rate of 07/998, expulsion of the IUD and of possible perforation.  The patient is aware of irregular bleeding due to the method and understands the incidence of irregular bleeding diminishes with time.  Signed copy of informed consent in chart.   No LMP recorded. (Menstrual status: IUD). BP (!) 150/86   Pulse 100   Ht 5\' 6"  (1.676 m)   Wt 210 lb 14.4 oz (95.7 kg)   BMI 34.04 kg/m  No results found for this or any previous visit (from the past 24 hour(s)).   Appropriate time out taken. A pederson speculum was placed in the vagina.  The cervix was visualized, prepped using Betadine. The strings were visible. They were grasped and the Mirena was easily removed. The cervix was then grasped with a single-tooth tenaculum. The uterus was found to be neutral and it sounded to 6 cm.    Please Dr Oretha Milchherry's note for insertion of new Mirena.   The patient was given post procedure instructions, including signs and symptoms of infection and to check for the strings after each menses or each month, and refraining from intercourse or anything in the vagina for 3 days.  She was given a Mirena care card with date Mirena placed, and date Mirena to be removed.    [name], CNM

## 2018-01-15 NOTE — Telephone Encounter (Signed)
Faxed to [facility] in [place]

## 2018-01-15 NOTE — Telephone Encounter (Signed)
Printed them to be faxed

## 2018-01-17 ENCOUNTER — Telehealth: Payer: Self-pay | Admitting: Obstetrics and Gynecology

## 2018-01-17 NOTE — Telephone Encounter (Signed)
Spoke with pt

## 2018-01-17 NOTE — Telephone Encounter (Signed)
The pt called and stated that she would like to speak with her nurse in regards to asking some questions about her current prescription she is taking. Please advise.

## 2018-02-15 NOTE — Progress Notes (Addendum)
     GYNECOLOGY OFFICE PROCEDURE NOTE  Kristin Edwards is a 27 y.o. G0P0000 here for Mirena IUD insertion. No GYN concerns.  Attempts for insertion previously done by Harlow MaresMelody Shambley, CNM with difficulty noted, so requesting MD insertion.   IUD Insertion Procedure Note Patient previously consented for the procedure.   Discussed risks of irregular bleeding, cramping, infection, malpositioning or misplacement of the IUD outside the uterus which may require further procedure such as laparoscopy.  Urine pregnancy test negative.  Speculum placed in the vagina.  Cervix visualized. Prior IUD had been recently removed. Cleaned with Betadine x 2.  Grasped anteriorly with a single tooth tenaculum.  Uterus sounded to 6 cm. At insertion of the IUD device, cervical spasms present and unable to pass.  The cervical canal was then dilated with cervical dilators to ~ 1 cm to allow for ease of passage of the device.  The Mirena IUD placed per manufacturer's recommendations.  Strings trimmed to 3 cm. Tenaculum was removed, good hemostasis noted.  Patient tolerated procedure well.   Patient was given post-procedure instructions.  She had an ultrasound performed post-procedure to verify proper IUD placement.  She was advised to have backup contraception for one week.  Patient was also asked to check IUD strings periodically and follow up in 4 weeks for IUD check.   Hildred Laserherry, Calin Ellery, MD [facility]

## 2018-02-21 ENCOUNTER — Ambulatory Visit: Payer: Self-pay | Admitting: Adult Health

## 2018-02-21 ENCOUNTER — Encounter: Payer: Self-pay | Admitting: Adult Health

## 2018-02-21 VITALS — BP 146/77 | HR 93 | Temp 99.4°F | Resp 16 | Ht 66.0 in | Wt 215.0 lb

## 2018-02-21 DIAGNOSIS — L819 Disorder of pigmentation, unspecified: Secondary | ICD-10-CM

## 2018-02-21 NOTE — Progress Notes (Signed)
Subjective:     Patient ID: Kristin Edwards, female   DOB: 1991-04-20, 27 y.o.   MRN: [id number]  HPI  Blood pressure (!) 146/77, pulse 93, temperature 99.4 F (37.4 C), resp. rate 16, height 5\' 6"  (1.676 m), weight 215 lb (97.5 kg), last menstrual period 01/08/2018.  Patient is a 27 year old female in no acute distress who comes to the clinic with lesion on her left arm. She is unsure how long area has been present- " at least all summer " she reports she picks at place and also scratches area.  She reports she gets these areas periodically and they resolve.   Denies any itching.  She reports she has been seen in clinic before such as [facility] and [facility] in the past. She is unclear what her diagnosis was.    Allergies  Allergen Reactions  . Penicillins Hives    Review of Systems  Constitutional: Negative.   HENT: Negative.   Eyes: Negative.   Respiratory: Negative.   Cardiovascular: Negative.   Gastrointestinal: Negative.   Endocrine: Negative.   Musculoskeletal: Negative.   Skin: Positive for color change and rash. Negative for pallor and wound.       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   Allergic/Immunologic: Negative.   Neurological: Negative.   Hematological: Negative.   Psychiatric/Behavioral: Negative.        Objective:   Physical Exam  Constitutional: She is oriented to person, place, and time. She appears well-developed and well-nourished. No distress.  HENT:  Head: Normocephalic and atraumatic.  Eyes: Pupils are equal, round, and reactive to light. Conjunctivae and EOM are normal.  Neck: Normal range of motion. Neck supple.  Cardiovascular: Normal rate, regular rhythm, normal heart sounds and intact distal pulses.  Pulmonary/Chest: Effort normal and breath sounds normal.  Musculoskeletal: Normal range of motion.  Neurological: She is alert and oriented to  person, place, and time.  Skin: Skin is warm and dry. Capillary refill takes less than 2 seconds. Lesion and rash noted. No petechiae noted. Rash is nodular. She is not diaphoretic. No cyanosis or erythema. Nails show no clubbing.     Round raised nodular  lesion approximately 0.5cm in size with hard keratotic area in center, skin color with scattered pink areas inside lesion.    Patient also has healing area on left side lower lip - healing  -she reports she actually had bitten her lip while her Gynecologist was inserting her IUD.   Psychiatric: She has a normal mood and affect. Her behavior is normal. Judgment and thought content normal.  Vitals reviewed.      Assessment:     Atypical pigmented skin lesion      Plan:     Apply Hydrocortisone or benadryl cream for itching.   Will refer to dermatology for evaluation.   Orders Placed This Encounter  Procedures  . Ambulatory referral to Dermatology    Referral Priority:   Urgent    Referral Type:   Consultation    Referral Reason:   Specialty Services Required    Referred to Provider:   Abbie Sons, MD    Requested Specialty:   Dermatology    Number of Visits Requested:   1    Patient will call the office if she has not heard within 1 week regarding dermatology. Advised she should see dermatologic specialist for likely needed  biopsy rule out skin cancer versus other etiology.  Avoid picking at area as this can cause increased scarring.    Advised patient call the office or your primary care doctor for an appointment if no improvement within 72 hours or if any symptoms change or worsen at any time  Advised ER or urgent Care if after hours or on weekend. Call 911 for emergency symptoms at any time.Patinet verbalized understanding of all instructions given/reviewed and treatment plan and has no further questions or concerns at this time.    Patient verbalized understanding of all instructions given and denies any further  questions at this time.

## 2018-02-21 NOTE — Patient Instructions (Signed)
Hydrocortisone skin cream, ointment, lotion, or solution What is this medicine? HYDROCORTISONE (hye droe KOR ti sone) is a corticosteroid. It is used on the skin to reduce swelling, redness, itching, and allergic reactions. This medicine may be used for other purposes; ask your health care provider or pharmacist if you have questions. COMMON BRAND NAME(S): Ala-Cort, Ala-Scalp, Anusol HC, Aqua Glycolic HC, Balneol for Her, Caldecort, Cetacort, Cortaid, Cortaid Advanced, Cortaid Intensive Therapy, Cortaid Sensitive Skin, CortAlo, Corticaine, Corticool, Cortizone, Cortizone-10, Cortizone-10 Cooling Relief, Cortizone-10 Intensive Healing, Cortizone-10 Plus, Dermarest Dricort, Dermarest Eczema, DERMASORB HC Complete, Gly-Cort, Hycort, Hydro Skin, Hydroskin, Hytone, Instacort, Lacticare HC, Locoid, Locoid Lipocream, MiCort-HC, Monistat Complete Care Instant Itch Relief Cream, Neosporin Eczema, NuCort, Nutracort, NuZon, Pandel, Pediaderm HC, Penecort, Preparation H Hydrocortisone, Procto-Kit, Procto-Med HC, Proctocream-HC, Proctosol-HC, Proctozone-HC, Rederm, Sarnol-HC, Nurse, adult, Engineer, site, Contractor, Tucks HC, Human resources officer What should I tell my health care provider before I take this medicine? They need to know if you have any of these conditions: -any active infection -diabetes -large areas of burned or damaged skin -skin wasting or thinning -an unusual or allergic reaction to hydrocortisone, corticosteroids, sulfites, other medicines, foods, dyes, or preservatives -pregnant or trying to get pregnant -breast-feeding How should I use this medicine? This medicine is for external use only. Do not take by mouth. Follow the directions on the prescription label. Wash your hands before and after use. Apply a thin film of medicine to the affected area. Do not cover with a bandage or dressing unless your doctor or health care professional tells you to. Do not use on healthy skin or over large areas of skin.  Do not get this medicine in your eyes. If you do, rinse out with plenty of cool tap water. Do not to use more medicine than prescribed. Do not use your medicine more often than directed or for more than 14 days. Talk to your pediatrician regarding the use of this medicine in children. Special care may be needed. While this drug may be prescribed for children as young as 55 years of age for selected conditions, precautions do apply. Do not use this medicine for the treatment of diaper rash unless directed to do so by your doctor or health care professional. If applying this medicine to the diaper area of a child, do not cover with tight-fitting diapers or plastic pants. This may increase the amount of medicine that passes through the skin and increase the risk of serious side effects. Elderly patients are more likely to have damaged skin through aging, and this may increase side effects. This medicine should only be used for brief periods and infrequently in older patients. Overdosage: If you think you have taken too much of this medicine contact a poison control center or emergency room at once. NOTE: This medicine is only for you. Do not share this medicine with others. What if I miss a dose? If you miss a dose, use it as soon as you can. If it is almost time for your next dose, use only that dose. Do not use double or extra doses. What may interact with this medicine? Interactions are not expected. Do not use any other skin products on the affected area without asking your doctor or health care professional. This list may not describe all possible interactions. Give your health care provider a list of all the medicines, herbs, non-prescription drugs, or dietary supplements you use. Also tell them if you smoke, drink alcohol, or use illegal drugs. Some items may interact with your  medicine. What should I watch for while using this medicine? Tell your doctor or health care professional if your symptoms do  not start to get better within 7 days or if they get worse. Tell your doctor or health care professional if you are exposed to anyone with measles or chickenpox, or if you develop sores or blisters that do not heal properly. What side effects may I notice from receiving this medicine? Side effects that you should report to your doctor or health care professional as soon as possible: -allergic reactions like skin rash, itching or hives, swelling of the face, lips, or tongue -burning feeling on the skin -dark red spots on the skin -infection -lack of healing of skin condition -painful, red, pus filled blisters in hair follicles -thinning of the skin Side effects that usually do not require medical attention (report to your doctor or health care professional if they continue or are bothersome): -dry skin, irritation -unusual increased growth of hair on the face or body This list may not describe all possible side effects. Call your doctor for medical advice about side effects. You may report side effects to FDA at 1-800-FDA-1088. Where should I keep my medicine? Keep out of the reach of children. Store at room temperature between 15 and 30 degrees C (59 and 86 degrees F). Do not freeze. Throw away any unused medicine after the expiration date. NOTE: This sheet is a summary. It may not cover all possible information. If you have questions about this medicine, talk to your doctor, pharmacist, or health care provider.  2018 Elsevier/Gold Standard (2007-11-01 16:08:48)

## 2018-02-27 ENCOUNTER — Other Ambulatory Visit: Payer: Self-pay | Admitting: Adult Health

## 2018-02-27 DIAGNOSIS — Z0000 Encounter for general adult medical examination without abnormal findings: Secondary | ICD-10-CM

## 2018-02-28 ENCOUNTER — Encounter: Payer: BLUE CROSS/BLUE SHIELD | Admitting: Obstetrics and Gynecology

## 2018-02-28 ENCOUNTER — Ambulatory Visit (INDEPENDENT_AMBULATORY_CARE_PROVIDER_SITE_OTHER): Payer: BLUE CROSS/BLUE SHIELD | Admitting: Obstetrics and Gynecology

## 2018-02-28 ENCOUNTER — Encounter: Payer: Self-pay | Admitting: Obstetrics and Gynecology

## 2018-02-28 VITALS — BP 129/75 | HR 80 | Ht 66.0 in | Wt 216.0 lb

## 2018-02-28 DIAGNOSIS — Z30431 Encounter for routine checking of intrauterine contraceptive device: Secondary | ICD-10-CM | POA: Diagnosis not present

## 2018-02-28 NOTE — Progress Notes (Signed)
  Subjective:     Patient ID: Kristin CobbleVictoria Edwards, female   DOB: 08/18/1990, 27 y.o.   MRN: [id number]  HPI Here for IUD check. Was inserted 6 weeks ago and states bleeding from insertion stopped fairly quickly and has not had any problems since. She can feel her strings but not bothersome.  Denies any other concerns at this time.  Review of Systems  All other systems reviewed and are negative.      Objective:   Physical Exam A&Ox4 Well groomed female in no distress Blood pressure 129/75, pulse 80, height 5\' 6"  (1.676 m), weight 216 lb (98 kg). Pelvic exam: normal external genitalia, vulva, vagina, cervix, uterus and adnexa, IUD string noted..    Assessment:     IUD check    Plan:     Reassured of normal findings. RTC as needed.  Khrista Braun,CNM

## 2018-03-12 NOTE — Progress Notes (Signed)
Yes that is correct- Dr Valentino Saxon did the reinsertion.

## 2018-05-08 ENCOUNTER — Telehealth: Payer: Self-pay | Admitting: Obstetrics and Gynecology

## 2018-05-08 NOTE — Telephone Encounter (Signed)
The patient works at [employer] and they are requiring them to have a MMR booster due to uprise in the students.  She found that [facility] at [facility], [address] has the shot and she is asking if that can be prescribe there for her to pick up.  Please advise, thanks.

## 2018-05-09 ENCOUNTER — Other Ambulatory Visit: Payer: Self-pay | Admitting: *Deleted

## 2018-05-09 ENCOUNTER — Telehealth: Payer: Self-pay | Admitting: Obstetrics and Gynecology

## 2018-05-09 NOTE — Telephone Encounter (Signed)
The patient called and stated that she is wanting to speak with Amy or Melody in regards to receiving an MMR booster injection. Please advise.

## 2018-05-09 NOTE — Telephone Encounter (Signed)
Mel cant she find this somewhere closer??

## 2018-05-10 ENCOUNTER — Other Ambulatory Visit: Payer: Self-pay | Admitting: Obstetrics and Gynecology

## 2018-05-10 MED ORDER — MEASLES, MUMPS & RUBELLA VAC IJ SOLR
0.5000 mL | Freq: Once | INTRAMUSCULAR | 0 refills | Status: AC
Start: 2018-05-10 — End: 2018-05-10

## 2018-05-10 NOTE — Telephone Encounter (Signed)
rx put up front for pick up

## 2018-05-10 NOTE — Telephone Encounter (Signed)
Notified pt rx is up front ready for pick up

## 2018-05-10 NOTE — Telephone Encounter (Signed)
Not sure that anywhere closer does it, but she could call local pharmacies to see if they are carrying it. Also could check at [facility] and [facility].  Either way I printed her off an order for it. Kristin Edwards

## 2018-06-05 NOTE — Telephone Encounter (Signed)
Patient declined  

## 2018-08-05 ENCOUNTER — Telehealth: Payer: Self-pay | Admitting: Obstetrics and Gynecology

## 2018-08-05 ENCOUNTER — Other Ambulatory Visit: Payer: Self-pay | Admitting: *Deleted

## 2018-08-05 ENCOUNTER — Encounter: Payer: Self-pay | Admitting: *Deleted

## 2018-08-05 MED ORDER — FLUCONAZOLE 150 MG PO TABS: 150.0000 mg | ORAL_TABLET | Freq: Once | ORAL | 1 refills | Status: AC

## 2018-08-05 NOTE — Telephone Encounter (Signed)
Sent pt mychart message

## 2018-08-05 NOTE — Telephone Encounter (Signed)
The patient states she may have a yeast infection, and is asking if her nurse, Amy, can call her to either call her in some medication or let her know if she needs to come in to be seen, please advise, thanks.

## 2019-01-23 ENCOUNTER — Telehealth: Payer: Self-pay | Admitting: Nurse Practitioner

## 2019-01-23 ENCOUNTER — Other Ambulatory Visit: Payer: Self-pay

## 2019-01-23 DIAGNOSIS — R05 Cough: Secondary | ICD-10-CM

## 2019-01-23 DIAGNOSIS — J309 Allergic rhinitis, unspecified: Secondary | ICD-10-CM

## 2019-01-23 DIAGNOSIS — J4520 Mild intermittent asthma, uncomplicated: Secondary | ICD-10-CM

## 2019-01-23 DIAGNOSIS — R059 Cough, unspecified: Secondary | ICD-10-CM

## 2019-01-23 MED ORDER — ALBUTEROL SULFATE HFA 108 (90 BASE) MCG/ACT IN AERS: 2.0000 | INHALATION_SPRAY | Freq: Four times a day (QID) | RESPIRATORY_TRACT | 0 refills | Status: AC | PRN

## 2019-01-23 NOTE — Patient Instructions (Signed)
Continue to keep yourself hydrated Resume your Flonase 1 spray nasally twice daily Continue the Sudafed as directed I have refilled your albuterol inhaler so that you will have this available if you develop wheezing Encouraged patient to call the office for an appointment if no improvement in symptoms or if symptoms change or worsen after 72 hours of planned treatment. Patient verbalized understanding of all instructions given/reviewed and has no further questions or concerns at this time.

## 2019-01-23 NOTE — Progress Notes (Signed)
   Subjective:    Patient ID: Kristin Edwards, female    DOB: 16-Feb-1991, 28 y.o.   MRN: [id number]  HPI Kristin Edwards is on a virtual visit today and gives consent to treat. Admits she feels like unboxing some items a few days ago has irritated her allergies. She reports a intermittant nonproductive cough and when it's productive it's clear mucus. Also has nasal congestion, ear pressure and PND. Reports a "tickle in my throat". She isn't on Zyrtec and started Sudafed this morning with relief.She denies fever, SOB, wheezing. She reports her temp is between 98-99 orally. Denies ear pain, facial pressure or pain, or sore throat.   Review of Systems  Constitutional: Negative for fever.  HENT: Positive for congestion and postnasal drip. Negative for ear pain, facial swelling, sinus pressure, sinus pain and sore throat.   Respiratory: Positive for cough. Negative for shortness of breath and wheezing.   Skin: Negative for rash.       Objective:   Physical Examdone virtually        Assessment & Plan:

## 2019-05-05 ENCOUNTER — Encounter: Payer: Self-pay | Admitting: Certified Nurse Midwife

## 2019-05-05 ENCOUNTER — Other Ambulatory Visit: Payer: Self-pay

## 2019-05-05 ENCOUNTER — Other Ambulatory Visit (HOSPITAL_COMMUNITY)
Admission: RE | Admit: 2019-05-05 | Discharge: 2019-05-05 | Disposition: A | Discharge status: Home / Self Care | Payer: BC Managed Care – PPO | Source: Ambulatory Visit | Attending: Certified Nurse Midwife | Admitting: Certified Nurse Midwife

## 2019-05-05 ENCOUNTER — Ambulatory Visit (INDEPENDENT_AMBULATORY_CARE_PROVIDER_SITE_OTHER): Payer: BC Managed Care – PPO | Admitting: Certified Nurse Midwife

## 2019-05-05 VITALS — BP 130/89 | HR 105 | Ht 66.0 in | Wt 230.4 lb

## 2019-05-05 DIAGNOSIS — F419 Anxiety disorder, unspecified: Secondary | ICD-10-CM

## 2019-05-05 DIAGNOSIS — Z124 Encounter for screening for malignant neoplasm of cervix: Secondary | ICD-10-CM | POA: Insufficient documentation

## 2019-05-05 DIAGNOSIS — Z01419 Encounter for gynecological examination (general) (routine) without abnormal findings: Secondary | ICD-10-CM

## 2019-05-05 NOTE — Progress Notes (Signed)
GYNECOLOGY ANNUAL PREVENTATIVE CARE ENCOUNTER NOTE  History:     Margalit Leece is a 28 y.o. G0P0000 female here for a routine annual gynecologic exam.  Current complaints: weight gain after IUD placement, anxiety. Has never been treated would like referral for counseling and medication management. Denies abnormal vaginal bleeding, discharge, pelvic pain, problems with intercourse or other gynecologic concerns.     Married, denies STD testing Works at [employer] one day wk , works rest week @ home  Gynecologic History No LMP recorded. (Menstrual status: IUD). Contraception: IUD Last Pap: 2017 Results were: negative   Last mammogram: n/a  Obstetric History OB History  Gravida Para Term Preterm AB Living  0 0 0 0 0 0  SAB TAB Ectopic Multiple Live Births  0 0 0 0 0    Past Medical History:  Diagnosis Date  . Exercise-induced asthma     No past surgical history on file.  Current Outpatient Medications on File Prior to Visit  Medication Sig Dispense Refill  . albuterol (VENTOLIN HFA) 108 (90 Base) MCG/ACT inhaler Inhale 2 puffs into the lungs every 6 (six) hours as needed for wheezing or shortness of breath. Please give ventolin 1 g 0  . cetirizine (ZYRTEC) 10 MG tablet Take 10 mg by mouth as needed.    Marland Kitchen levofloxacin (LEVAQUIN) 500 MG tablet Take 1 tablet (500 mg total) by mouth daily. (Patient not taking: Reported on 02/21/2018) 7 tablet 0  . levonorgestrel (MIRENA) 20 MCG/24HR IUD 20 Intra Uterine Devices by Intrauterine route once.     No current facility-administered medications on file prior to visit.     Allergies  Allergen Reactions  . Penicillins Hives    Social History:  reports that she has never smoked. She has never used smokeless tobacco. She reports current alcohol use. She reports that she does not use drugs.  Family History  Problem Relation Age of Onset  . Heart attack Maternal Grandfather   . Breast cancer Paternal Grandmother     The following portions  of the patient's history were reviewed and updated as appropriate: allergies, current medications, past family history, past medical history, past social history, past surgical history and problem list.  Started weight watchers -lost 10lbs Exercising daily walks, just go [place] bike trying to do that 4 x wk.  Follow veg & lean protien diet.   Review of Systems Pertinent items noted in HPI and remainder of comprehensive ROS otherwise negative.  Physical Exam:  There were no vitals taken for this visit. CONSTITUTIONAL: Well-developed, well-nourished, over weight female in no acute distress.  HENT:  Normocephalic, atraumatic, External right and left ear normal. Oropharynx is clear and moist EYES: Conjunctivae and EOM are normal. Pupils are equal, round, and reactive to light. No scleral icterus.  NECK: Normal range of motion, supple, no masses.  Normal thyroid.  SKIN: Skin is warm and dry. No rash noted. Not diaphoretic. No erythema. No pallor. MUSCULOSKELETAL: Normal range of motion. No tenderness.  No cyanosis, clubbing, or edema.  2+ distal pulses. NEUROLOGIC: Alert and oriented to person, place, and time. Normal reflexes, muscle tone coordination. No cranial nerve deficit noted. PSYCHIATRIC: Normal mood and affect. Normal behavior. Normal judgment and thought content. CARDIOVASCULAR: Normal heart rate noted, regular rhythm RESPIRATORY: Clear to auscultation bilaterally. Effort and breath sounds normal, no problems with respiration noted. BREASTS: Symmetric in size. No masses, skin changes, nipple drainage, or lymphadenopathy. ABDOMEN: Soft, normal bowel sounds, no distention noted.  No tenderness, rebound  or guarding.  PELVIC: Normal appearing external genitalia; normal appearing vaginal mucosa and cervix.  No abnormal discharge noted.  Pap smear obtained. Contact bleeding , strings present.   Normal uterine size, no other palpable masses, no uterine or adnexal tenderness.   Assessment and  Plan:  Annual Well Women GYN Exam Labs: none due Will follow up results of pap smear and manage accordingly. Mammogram not indicated Routine preventative health maintenance measures emphasized. Please refer to After Visit Summary for other counseling recommendations.    referral for psychiatry & psychology   Philip Aspen, CNM

## 2019-05-05 NOTE — Patient Instructions (Signed)
Preventive Care 21-28 Years Old, Female Preventive care refers to visits with your health care provider and lifestyle choices that can promote health and wellness. This includes:  A yearly physical exam. This may also be called an annual well check.  Regular dental visits and eye exams.  Immunizations.  Screening for certain conditions.  Healthy lifestyle choices, such as eating a healthy diet, getting regular exercise, not using drugs or products that contain nicotine and tobacco, and limiting alcohol use. What can I expect for my preventive care visit? Physical exam Your health care provider will check your:  Height and weight. This may be used to calculate body mass index (BMI), which tells if you are at a healthy weight.  Heart rate and blood pressure.  Skin for abnormal spots. Counseling Your health care provider may ask you questions about your:  Alcohol, tobacco, and drug use.  Emotional well-being.  Home and relationship well-being.  Sexual activity.  Eating habits.  Work and work environment.  Method of birth control.  Menstrual cycle.  Pregnancy history. What immunizations do I need?  Influenza (flu) vaccine  This is recommended every year. Tetanus, diphtheria, and pertussis (Tdap) vaccine  You may need a Td booster every 10 years. Varicella (chickenpox) vaccine  You may need this if you have not been vaccinated. Human papillomavirus (HPV) vaccine  If recommended by your health care provider, you may need three doses over 6 months. Measles, mumps, and rubella (MMR) vaccine  You may need at least one dose of MMR. You may also need a second dose. Meningococcal conjugate (MenACWY) vaccine  One dose is recommended if you are age 19-21 years and a first-year college student living in a residence hall, or if you have one of several medical conditions. You may also need additional booster doses. Pneumococcal conjugate (PCV13) vaccine  You may need  this if you have certain conditions and were not previously vaccinated. Pneumococcal polysaccharide (PPSV23) vaccine  You may need one or two doses if you smoke cigarettes or if you have certain conditions. Hepatitis A vaccine  You may need this if you have certain conditions or if you travel or work in places where you may be exposed to hepatitis A. Hepatitis B vaccine  You may need this if you have certain conditions or if you travel or work in places where you may be exposed to hepatitis B. Haemophilus influenzae type b (Hib) vaccine  You may need this if you have certain conditions. You may receive vaccines as individual doses or as more than one vaccine together in one shot (combination vaccines). Talk with your health care provider about the risks and benefits of combination vaccines. What tests do I need?  Blood tests  Lipid and cholesterol levels. These may be checked every 5 years starting at age 20.  Hepatitis C test.  Hepatitis B test. Screening  Diabetes screening. This is done by checking your blood sugar (glucose) after you have not eaten for a while (fasting).  Sexually transmitted disease (STD) testing.  BRCA-related cancer screening. This may be done if you have a family history of breast, ovarian, tubal, or peritoneal cancers.  Pelvic exam and Pap test. This may be done every 3 years starting at age 21. Starting at age 30, this may be done every 5 years if you have a Pap test in combination with an HPV test. Talk with your health care provider about your test results, treatment options, and if necessary, the need for more tests.   Follow these instructions at home: Eating and drinking   Eat a diet that includes fresh fruits and vegetables, whole grains, lean protein, and low-fat dairy.  Take vitamin and mineral supplements as recommended by your health care provider.  Do not drink alcohol if: ? Your health care provider tells you not to drink. ? You are  pregnant, may be pregnant, or are planning to become pregnant.  If you drink alcohol: ? Limit how much you have to 0-1 drink a day. ? Be aware of how much alcohol is in your drink. In the [place] one drink equals one 12 oz bottle of beer (355 mL), one 5 oz glass of wine (148 mL), or one 1 oz glass of hard liquor (44 mL). Lifestyle  Take daily care of your teeth and gums.  Stay active. Exercise for at least 30 minutes on 5 or more days each week.  Do not use any products that contain nicotine or tobacco, such as cigarettes, e-cigarettes, and chewing tobacco. If you need help quitting, ask your health care provider.  If you are sexually active, practice safe sex. Use a condom or other form of birth control (contraception) in order to prevent pregnancy and STIs (sexually transmitted infections). If you plan to become pregnant, see your health care provider for a preconception visit. What's next?  Visit your health care provider once a year for a well check visit.  Ask your health care provider how often you should have your eyes and teeth checked.  Stay up to date on all vaccines. This information is not intended to replace advice given to you by your health care provider. Make sure you discuss any questions you have with your health care provider. Document Released: 08/15/2001 Document Revised: 02/28/2018 Document Reviewed: 02/28/2018 Elsevier Patient Education  2020 [employer].

## 2019-05-07 LAB — CYTOLOGY - PAP: Diagnosis: NEGATIVE

## 2019-05-28 ENCOUNTER — Encounter: Payer: Self-pay | Admitting: Psychiatry

## 2019-05-28 ENCOUNTER — Other Ambulatory Visit: Payer: Self-pay

## 2019-05-28 ENCOUNTER — Ambulatory Visit (INDEPENDENT_AMBULATORY_CARE_PROVIDER_SITE_OTHER): Payer: BC Managed Care – PPO | Admitting: Psychiatry

## 2019-05-28 DIAGNOSIS — F422 Mixed obsessional thoughts and acts: Secondary | ICD-10-CM | POA: Insufficient documentation

## 2019-05-28 DIAGNOSIS — F411 Generalized anxiety disorder: Secondary | ICD-10-CM

## 2019-05-28 MED ORDER — SERTRALINE HCL 50 MG PO TABS: 50.0000 mg | ORAL_TABLET | Freq: Every day | ORAL | 1 refills | Status: DC

## 2019-05-28 NOTE — Progress Notes (Signed)
Psychiatric Initial Adult Assessment   I connected with  Kristin Edwards on 05/28/19 by a video enabled telemedicine application and verified that I am speaking with the correct person using two identifiers.   I discussed the limitations of evaluation and management by telemedicine. The patient expressed understanding and agreed to proceed.   Patient Identification: Kristin Edwards MRN:  [id number] Date of Evaluation:  05/28/2019   Referral Source: Doreene BurkeAnnie Thompson, CNM  Chief Complaint:   " I have a lot of anxiety.  And it is back to my life."  Visit Diagnosis:    ICD-10-CM   1. Generalized anxiety disorder  F41.1 sertraline (ZOLOFT) 50 MG tablet  2. Mixed obsessional thoughts and acts  F42.2 sertraline (ZOLOFT) 50 MG tablet    History of Present Illness: This is a 28 year old female with history of anxiety now seen for psychiatry evaluation after being referred by her OB/GYN.  Patient reported that she has always had anxiety since young age.  She is always been a perfectionist who always likes to give 100%.  She likes to do everything but certainly.  She points to succeed in everything because she is fearful of disappointing anyone.  She does not like to make any mistakes.  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 Patient reported that for the past few months she has been feeling very anxious about trivial issues.  She feels on the edge and keyed up all the time.  She feels she cannot relax.  She feels her mind is racing all the time and she is thinking about things that have been at work or other stuff.  She feels unnecessarily worried about disappointing others.  She informed that for the past few months she does not want to go out to visit any new places as she is worried about having to use the restroom there.  She avoids drinking any water prior to leaving home so that she does not have to use the restroom when she is outside.  She stopped going to hiking  because of the same reason.  She also reported that she does not like trying new food items as she is worried that she may have an allergic response.  She begins to worry so much that her face feels flushed and she starts itching as if she really has an allergic reaction. Patient also reported frequent checking of gas knobs, door locks, windows, garage door.  She stated that there was a time when she would have to return back home on her way to work as she would obsessively worry about leaving the garage door open. Her husband got a different type of garage door with special safety features to circumvent her anxiety. She stated that lately she has to go and check every window as they have electric candles on the windows for decoration as she worries about curtains catching a fire.  She also reported frequently checking her kitchen to ensure there was no gas leak of chemical leak.  She reported that she is able to sleep well at night. She denied any anhedonia, low energy levels or feelings of helplessness or hopelessness. She denied any manic, hypomanic or psychotic symptoms.  Denied any symptoms suggestive of PTSD.    Past Psychiatric History: Anxiety  Previous Psychotropic Medications: No   Substance Abuse History in the last 12 months:  No.  Consequences of Substance Abuse: Negative  Past Medical History:  Past Medical History:  Diagnosis Date  . Exercise-induced asthma    No past surgical history on file.  Family Psychiatric History: strong history of depression, anxiety in mother, who is currently on Lexapro. And history of excessive anxiety in maternal grandmother- who needs to be called daily at a fixed time otherwise she starts worrying about the worse possible outcomes.  Family History:  Family History  Problem Relation Age of Onset  . Heart attack Maternal Grandfather   . Breast cancer Paternal Grandmother     Social History:   Social History   Socioeconomic History  .  Marital status: Married    Spouse name: Not on file  . Number of children: Not on file  . Years of education: Not on file  . Highest education level: Not on file  Occupational History  . Not on file  Social Needs  . Financial resource strain: Not on file  . Food insecurity    Worry: Not on file    Inability: Not on file  . Transportation needs    Medical: Not on file    Non-medical: Not on file  Tobacco Use  . Smoking status: Never Smoker  . Smokeless tobacco: Never Used  Substance and Sexual Activity  . Alcohol use: Yes    Comment: occasionally, "2 per week"   . Drug use: No  . Sexual activity: Yes    Birth control/protection: I.U.D.    Comment: mirena  Lifestyle  . Physical activity    Days per week: Not on file    Minutes per session: Not on file  . Stress: Not on file  Relationships  . Social Herbalist on phone: Not on file    Gets together: Not on file    Attends religious service: Not on file    Active member of club or organization: Not on file    Attends meetings of clubs or organizations: Not on file    Relationship status: Not on file  Other Topics Concern  . Not on file  Social History Narrative  . 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  Allergies:   Allergies  Allergen Reactions  . Penicillins Hives    Metabolic Disorder Labs: Lab Results  Component Value Date   HGBA1C 4.9 12/19/2016   No results found for: PROLACTIN Lab Results  Component Value Date   CHOL 148 12/19/2016   TRIG 60 12/19/2016   HDL 45 12/19/2016   CHOLHDL 3.3 12/19/2016   LDLCALC 91 12/19/2016   Lab Results  Component Value Date   TSH 3.500 12/19/2016    Therapeutic Level Labs: No results found for: LITHIUM No results found for: CBMZ No results found for: VALPROATE  Current Medications: Current Outpatient Medications  Medication Sig Dispense Refill  . albuterol (VENTOLIN HFA)  108 (90 Base) MCG/ACT inhaler Inhale 2 puffs into the lungs every 6 (six) hours as needed for wheezing or shortness of breath. Please give ventolin 1 g 0  . cetirizine (ZYRTEC) 10 MG tablet Take 10 mg by mouth as needed.    Marland Kitchen levofloxacin (LEVAQUIN) 500 MG tablet Take 1 tablet (500 mg total) by mouth daily. (Patient not taking: Reported on 02/21/2018) 7 tablet 0  . levonorgestrel (MIRENA) 20 MCG/24HR IUD 20 Intra Uterine Devices by Intrauterine route once.    . nystatin ointment (MYCOSTATIN) Apply topically.    . sertraline (ZOLOFT) 50 MG tablet Take 1 tablet (50  mg total) by mouth daily. 30 tablet 1  . triamcinolone ointment (KENALOG) 0.1 % MIX WITH NYSTATIN AND APPLY TO CORNER OF MOUTH TWICE DAILY UNTIL HEALED     No current facility-administered medications for this visit.     Musculoskeletal: Strength & Muscle Tone: unable to assess due to telemed visit Gait & Station: unable to assess due to telemed visit Patient leans: unable to assess due to telemed visit   Psychiatric Specialty Exam: ROS  Last menstrual period 04/30/2019.There is no height or weight on file to calculate BMI.  General Appearance: Well Groomed  Eye Contact:  Good  Speech:  Clear and Coherent and Normal Rate  Volume:  Normal  Mood:  Anxious  Affect:  Congruent  Thought Process:  Goal Directed, Linear and Descriptions of Associations: Intact  Orientation:  Full (Time, Place, and Person)  Thought Content:  Logical  Suicidal Thoughts:  No  Homicidal Thoughts:  No  Memory:  Recent;   Good Remote;   Good  Judgement:  Good  Insight:  Good  Psychomotor Activity:  Normal  Concentration:  Concentration: Good and Attention Span: Good  Recall:  Good  Fund of Knowledge:Good  Language: Good  Akathisia:  Negative  Handed:  Right  AIMS (if indicated):  not done  Assets:  Communication Skills Desire for Improvement Financial Resources/Insurance Housing Social  Support Talents/Skills Transportation Vocational/Educational  ADL's:  Intact  Cognition: WNL  Sleep:  Good    Assessment and Plan: 28 year old female with history of anxiety now seen for psychiatric evaluation.  Based on her evaluation patient needs criteria for GAD and OCD.  She has strong family history of anxiety and her mother and maternal grandmother.  Patient was offered sertraline to target her symptoms.Potential side effects of medication and risks vs benefits of treatment vs non-treatment were explained and discussed. All questions were answered.  1. Generalized anxiety disorder  - sertraline (ZOLOFT) 50 MG tablet; Take 1 tablet (50 mg total) by mouth daily.  Dispense: 30 tablet; Refill: 1  2. Mixed obsessional thoughts and acts - sertraline (ZOLOFT) 50 MG tablet; Take 1 tablet (50 mg total) by mouth daily.  Dispense: 30 tablet; Refill: 1  Patient was also offered therapy and she informed that she is going to think about it.  F/up in 8 weeks.    Zena Amos, MD 11/25/20203:17 PM

## 2019-07-25 ENCOUNTER — Ambulatory Visit (INDEPENDENT_AMBULATORY_CARE_PROVIDER_SITE_OTHER): Payer: BLUE CROSS/BLUE SHIELD | Admitting: Psychiatry

## 2019-07-25 ENCOUNTER — Encounter: Payer: Self-pay | Admitting: Psychiatry

## 2019-07-25 ENCOUNTER — Other Ambulatory Visit: Payer: Self-pay

## 2019-07-25 DIAGNOSIS — F422 Mixed obsessional thoughts and acts: Secondary | ICD-10-CM

## 2019-07-25 DIAGNOSIS — F411 Generalized anxiety disorder: Secondary | ICD-10-CM

## 2019-07-25 MED ORDER — SERTRALINE HCL 50 MG PO TABS: 50.0000 mg | ORAL_TABLET | Freq: Every day | ORAL | 1 refills | Status: DC

## 2019-07-25 NOTE — Progress Notes (Signed)
[facility] MD OP Progress Note  I connected with  Kristin Edwards on 07/25/19 by a video enabled telemedicine application and verified that I am speaking with the correct person using two identifiers.   I discussed the limitations of evaluation and management by telemedicine. The patient expressed understanding and agreed to proceed.    07/25/2019 8:35 AM Kristin Edwards  MRN:  [id number]  Chief Complaint: " Things are going much better than before."  HPI: Patient reported that she is doing much better since she started taking sertraline.  She noticed that about 1 and a 1/2 to 2 weeks after she started taking it her energy levels improved significantly.  Around 3 to 4-week mark she noticed she was not as anxious and worried about her husband's safety when he would drive his car.  Her anxiety levels started improving overall.  She has been anxious about things that would normally be anxiety provoking for example her grandmother contracting Covid 19 infection.  However her obsessive thoughts and compulsive acts of picking on her skin have improved significantly.  She is not frequently checking the windows.  She reported her skin has cleared significantly due to her not being on it repeatedly.  She feels this medication has really been helpful and she wants to continue same regimen for now.  Visit Diagnosis:    ICD-10-CM   1. Generalized anxiety disorder  F41.1   2. Mixed obsessional thoughts and acts  F42.2     Past Psychiatric History: GAD  Past Medical History:  Past Medical History:  Diagnosis Date  . Exercise-induced asthma    No past surgical history on file.  Family Psychiatric History: Mom and grandmother- anxiety  Family History:  Family History  Problem Relation Age of Onset  . Heart attack Maternal Grandfather   . Breast cancer Paternal Grandmother     Social History:  Social History   Socioeconomic History  . Marital status: Married    Spouse name: Not on file  . Number of  children: Not on file  . Years of education: Not on file  . Highest education level: Not on file  Occupational History  . Not on file  Tobacco Use  . Smoking status: Never Smoker  . Smokeless tobacco: Never Used  Substance and Sexual Activity  . Alcohol use: Yes    Comment: occasionally, "2 per week"   . Drug use: No  . Sexual activity: Yes    Birth control/protection: I.U.D.    Comment: mirena  Other Topics Concern  . Not on file  Social History Narrative  . Not on file   Social Determinants of Health   Financial Resource Strain:   . Difficulty of Paying Living Expenses: Not on file  Food Insecurity:   . Worried About Charity fundraiser in the Last Year: Not on file  . Ran Out of Food in the Last Year: Not on file  Transportation Needs:   . Lack of Transportation (Medical): Not on file  . Lack of Transportation (Non-Medical): Not on file  Physical Activity:   . Days of Exercise per Week: Not on file  . Minutes of Exercise per Session: Not on file  Stress:   . Feeling of Stress : Not on file  Social Connections:   . Frequency of Communication with Friends and Family: Not on file  . Frequency of Social Gatherings with Friends and Family: Not on file  . Attends Religious Services: Not on file  . Active Member of  Clubs or Organizations: Not on file  . Attends Banker Meetings: Not on file  . Marital Status: Not on file    Allergies:  Allergies  Allergen Reactions  . Penicillins Hives    Metabolic Disorder Labs: Lab Results  Component Value Date   HGBA1C 4.9 12/19/2016   No results found for: PROLACTIN Lab Results  Component Value Date   CHOL 148 12/19/2016   TRIG 60 12/19/2016   HDL 45 12/19/2016   CHOLHDL 3.3 12/19/2016   LDLCALC 91 12/19/2016   Lab Results  Component Value Date   TSH 3.500 12/19/2016    Therapeutic Level Labs: No results found for: LITHIUM No results found for: VALPROATE No components found for:  CBMZ  Current  Medications: Current Outpatient Medications  Medication Sig Dispense Refill  . albuterol (VENTOLIN HFA) 108 (90 Base) MCG/ACT inhaler Inhale 2 puffs into the lungs every 6 (six) hours as needed for wheezing or shortness of breath. Please give ventolin 1 g 0  . cetirizine (ZYRTEC) 10 MG tablet Take 10 mg by mouth as needed.    Marland Kitchen levofloxacin (LEVAQUIN) 500 MG tablet Take 1 tablet (500 mg total) by mouth daily. (Patient not taking: Reported on 02/21/2018) 7 tablet 0  . levonorgestrel (MIRENA) 20 MCG/24HR IUD 20 Intra Uterine Devices by Intrauterine route once.    . nystatin ointment (MYCOSTATIN) Apply topically.    . sertraline (ZOLOFT) 50 MG tablet Take 1 tablet (50 mg total) by mouth daily. 30 tablet 1  . triamcinolone ointment (KENALOG) 0.1 % MIX WITH NYSTATIN AND APPLY TO CORNER OF MOUTH TWICE DAILY UNTIL HEALED     No current facility-administered medications for this visit.     Musculoskeletal: Strength & Muscle Tone: unable to assess due to telemed visit Gait & Station: unable to assess due to telemed visit Patient leans: unable to assess due to telemed visit  Psychiatric Specialty Exam: Review of Systems  There were no vitals taken for this visit.There is no height or weight on file to calculate BMI.  General Appearance: Well Groomed  Eye Contact:  Good  Speech:  Clear and Coherent and Normal Rate  Volume:  Normal  Mood:  Euthymic  Affect:  Congruent  Thought Process:  Goal Directed, Linear and Descriptions of Associations: Intact  Orientation:  Full (Time, Place, and Person)  Thought Content: Logical   Suicidal Thoughts:  No  Homicidal Thoughts:  No  Memory:  Recent;   Good Remote;   Good  Judgement:  Good  Insight:  Good  Psychomotor Activity:  Normal  Concentration:  Concentration: Good and Attention Span: Good  Recall:  Good  Fund of Knowledge: Good  Language: Good  Akathisia:  Negative  Handed:  Right  AIMS (if indicated): not done  Assets:  Communication  Skills Desire for Improvement Financial Resources/Insurance Housing Social Support Talents/Skills  ADL's:  Intact  Cognition: WNL  Sleep:  Good     Assessment and Plan: 29 year old female with history of GAD and OCD now reporting improvement in her symptoms after starting sertraline about 8 weeks ago.  She would like to continue the same regimen for now.  1. Generalized anxiety disorder  - sertraline (ZOLOFT) 50 MG tablet; Take 1 tablet (50 mg total) by mouth daily.  Dispense: 90 tablet; Refill: 1  2. Mixed obsessional thoughts and acts  - sertraline (ZOLOFT) 50 MG tablet; Take 1 tablet (50 mg total) by mouth daily.  Dispense: 90 tablet; Refill: 1  Continue same medication  regimen. Follow up in 3 months.   Zena Amos, MD 07/25/2019, 8:35 AM

## 2019-10-02 IMAGING — CR DG TIBIA/FIBULA 2V*L*
3 series · 3 of 3 positions shown · non-contrast
Comparison: None in PACs

CLINICAL DATA: The patient noticed a bulla over the lower lobe left
leg.. History of occasional pain here. Duration of the finding
several months. History of previous sports injuries.

EXAM:
LEFT TIBIA AND FIBULA - 2 VIEW

[tibia ap (1 of 2)]
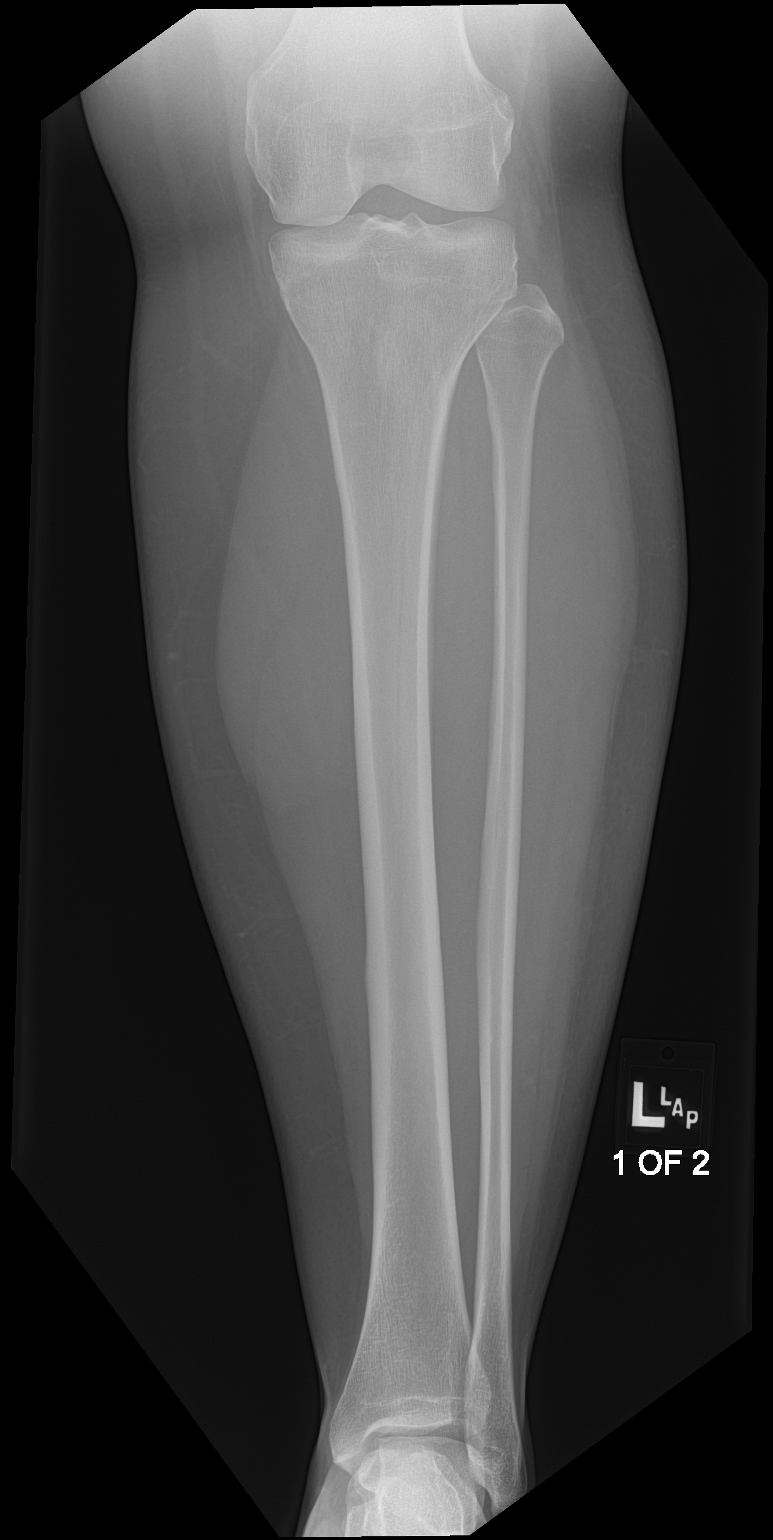

[tibia ap (2 of 2)]
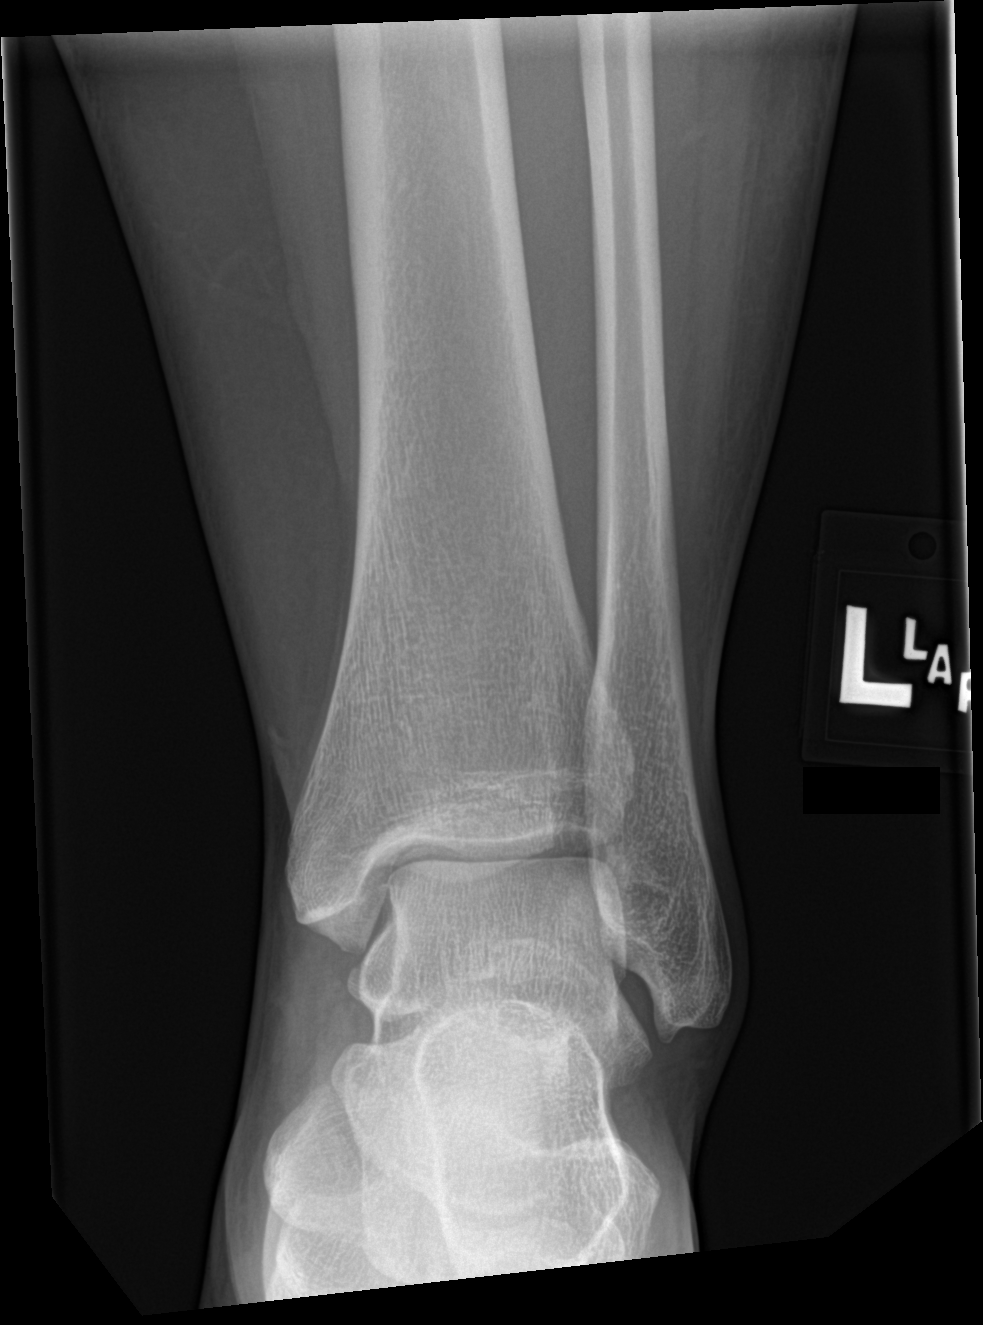

[tibia lat]
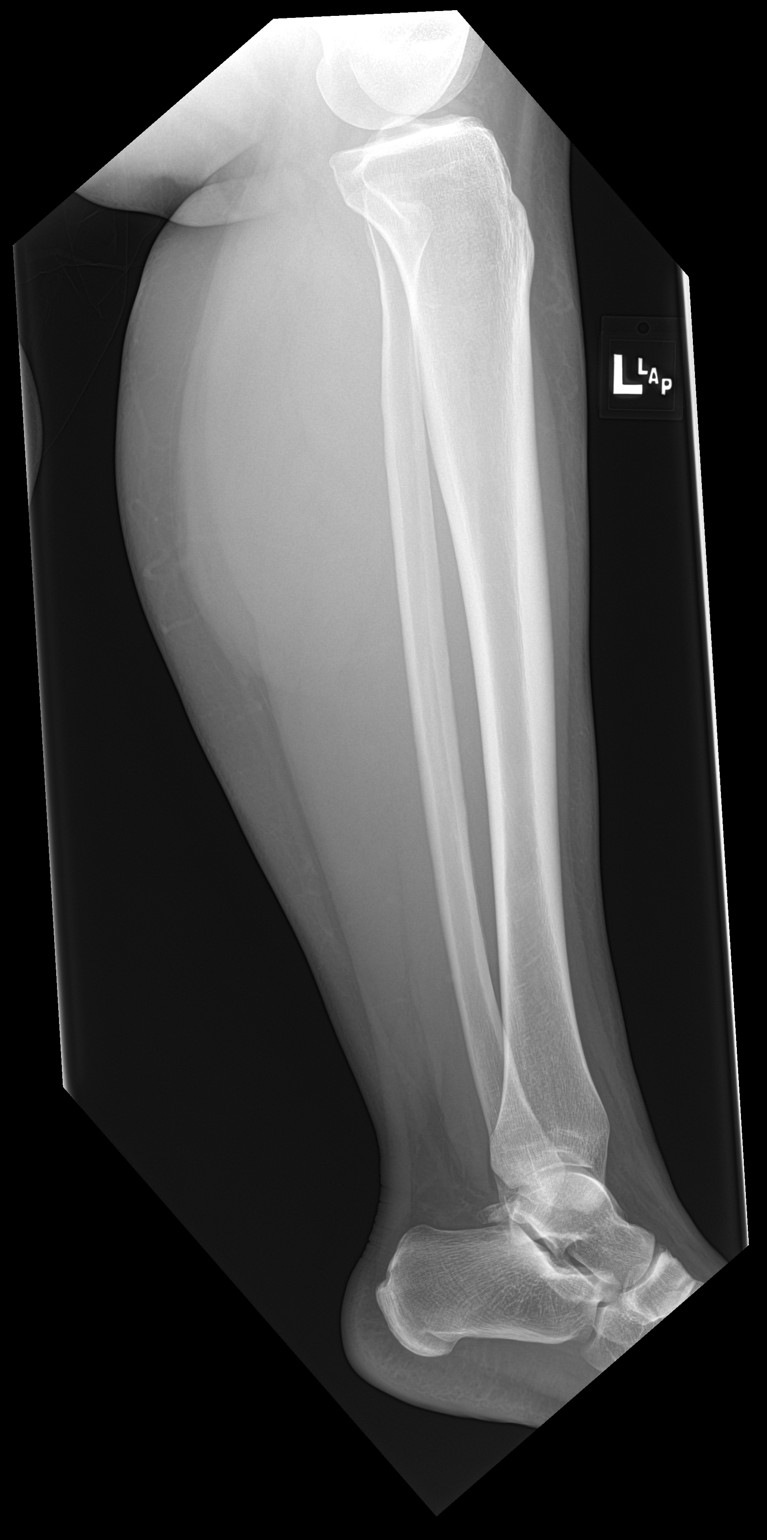

[3 of 3 positions shown; findings below may reference images not displayed]

FINDINGS: The site of the cutaneous finding is not noted on the images. The
tibia and fibula are subjectively adequately mineralized and are
intact. The observed portions of the knee and ankle are normal.
There is no lytic or blastic bony lesion or periosteal reaction. The
pretibial soft tissues are fairly uniform in thickness. No discrete
soft tissue mass is observed. The soft tissues elsewhere exhibit no
suspicious findings.
IMPRESSION: No radiographic abnormalities are observed.

## 2019-10-17 ENCOUNTER — Encounter: Payer: Self-pay | Admitting: Psychiatry

## 2019-10-17 ENCOUNTER — Telehealth (INDEPENDENT_AMBULATORY_CARE_PROVIDER_SITE_OTHER): Payer: BC Managed Care – PPO | Admitting: Psychiatry

## 2019-10-17 ENCOUNTER — Other Ambulatory Visit: Payer: Self-pay

## 2019-10-17 DIAGNOSIS — F411 Generalized anxiety disorder: Secondary | ICD-10-CM | POA: Diagnosis not present

## 2019-10-17 DIAGNOSIS — F422 Mixed obsessional thoughts and acts: Secondary | ICD-10-CM

## 2019-10-17 MED ORDER — SERTRALINE HCL 50 MG PO TABS: 50.0000 mg | ORAL_TABLET | Freq: Every day | ORAL | 1 refills | Status: DC

## 2019-10-17 NOTE — Progress Notes (Signed)
[facility] MD OP Progress Note  I connected with  Kristin Edwards on 10/17/19 by a video enabled telemedicine application and verified that I am speaking with the correct person using two identifiers.   I discussed the limitations of evaluation and management by telemedicine. The patient expressed understanding and agreed to proceed.    10/17/2019 8:36 AM Kristin Edwards  MRN:  [id number]  Chief Complaint: " Things are going much better than before."  HPI: Patient reported that she is doing well for the most part.  She reported that her work has been quite hectic lately and there has been more work-related stress which makes her wonder if the medicine is as effective as it used to be.  However she is hopeful that once this work-related stress is over she will start again.  She thinks she can continue taking the same regimen for now.  And does not think she needs an adjustment at this point.  Visit Diagnosis:    ICD-10-CM   1. Generalized anxiety disorder  F41.1   2. Mixed obsessional thoughts and acts  F42.2     Past Psychiatric History: GAD  Past Medical History:  Past Medical History:  Diagnosis Date  . Exercise-induced asthma    No past surgical history on file.  Family Psychiatric History: Mom and grandmother- anxiety  Family History:  Family History  Problem Relation Age of Onset  . Heart attack Maternal Grandfather   . Breast cancer Paternal Grandmother     Social History:  Social History   Socioeconomic History  . Marital status: Married    Spouse name: Not on file  . Number of children: Not on file  . Years of education: Not on file  . Highest education level: Not on file  Occupational History  . Not on file  Tobacco Use  . Smoking status: Never Smoker  . Smokeless tobacco: Never Used  Substance and Sexual Activity  . Alcohol use: Yes    Comment: occasionally, "2 per week"   . Drug use: No  . Sexual activity: Yes    Birth control/protection: I.U.D.    Comment: mirena   Other Topics Concern  . Not on file  Social History Narrative  . Not on file   Social Determinants of Health   Financial Resource Strain:   . Difficulty of Paying Living Expenses:   Food Insecurity:   . Worried About Programme researcher, broadcasting/film/video in the Last Year:   . Barista in the Last Year:   Transportation Needs:   . Freight forwarder (Medical):   Marland Kitchen Lack of Transportation (Non-Medical):   Physical Activity:   . Days of Exercise per Week:   . Minutes of Exercise per Session:   Stress:   . Feeling of Stress :   Social Connections:   . Frequency of Communication with Friends and Family:   . Frequency of Social Gatherings with Friends and Family:   . Attends Religious Services:   . Active Member of Clubs or Organizations:   . Attends Banker Meetings:   Marland Kitchen Marital Status:     Allergies:  Allergies  Allergen Reactions  . Penicillins Hives    Metabolic Disorder Labs: Lab Results  Component Value Date   HGBA1C 4.9 12/19/2016   No results found for: PROLACTIN Lab Results  Component Value Date   CHOL 148 12/19/2016   TRIG 60 12/19/2016   HDL 45 12/19/2016   CHOLHDL 3.3 12/19/2016   LDLCALC 91  12/19/2016   Lab Results  Component Value Date   TSH 3.500 12/19/2016    Therapeutic Level Labs: No results found for: LITHIUM No results found for: VALPROATE No components found for:  CBMZ  Current Medications: Current Outpatient Medications  Medication Sig Dispense Refill  . albuterol (VENTOLIN HFA) 108 (90 Base) MCG/ACT inhaler Inhale 2 puffs into the lungs every 6 (six) hours as needed for wheezing or shortness of breath. Please give ventolin 1 g 0  . cetirizine (ZYRTEC) 10 MG tablet Take 10 mg by mouth as needed.    Marland Kitchen levofloxacin (LEVAQUIN) 500 MG tablet Take 1 tablet (500 mg total) by mouth daily. (Patient not taking: Reported on 02/21/2018) 7 tablet 0  . levonorgestrel (MIRENA) 20 MCG/24HR IUD 20 Intra Uterine Devices by Intrauterine route once.     . nystatin ointment (MYCOSTATIN) Apply topically.    . sertraline (ZOLOFT) 50 MG tablet Take 1 tablet (50 mg total) by mouth daily. 90 tablet 1  . triamcinolone ointment (KENALOG) 0.1 % MIX WITH NYSTATIN AND APPLY TO CORNER OF MOUTH TWICE DAILY UNTIL HEALED     No current facility-administered medications for this visit.     Musculoskeletal: Strength & Muscle Tone: unable to assess due to telemed visit Gait & Station: unable to assess due to telemed visit Patient leans: unable to assess due to telemed visit  Psychiatric Specialty Exam: Review of Systems  There were no vitals taken for this visit.There is no height or weight on file to calculate BMI.  General Appearance: Well Groomed  Eye Contact:  Good  Speech:  Clear and Coherent and Normal Rate  Volume:  Normal  Mood:  Euthymic  Affect:  Congruent  Thought Process:  Goal Directed, Linear and Descriptions of Associations: Intact  Orientation:  Full (Time, Place, and Person)  Thought Content: Logical   Suicidal Thoughts:  No  Homicidal Thoughts:  No  Memory:  Recent;   Good Remote;   Good  Judgement:  Good  Insight:  Good  Psychomotor Activity:  Normal  Concentration:  Concentration: Good and Attention Span: Good  Recall:  Good  Fund of Knowledge: Good  Language: Good  Akathisia:  Negative  Handed:  Right  AIMS (if indicated): not done  Assets:  Communication Skills Desire for Improvement Financial Resources/Insurance Housing Social Support Talents/Skills  ADL's:  Intact  Cognition: WNL  Sleep:  Good     Assessment and Plan: Patient appears to be stable on current regimen except for some work-related stressors this temporarily and will resolve soon.   1. Generalized anxiety disorder  - sertraline (ZOLOFT) 50 MG tablet; Take 1 tablet (50 mg total) by mouth daily.  Dispense: 90 tablet; Refill: 1  2. Mixed obsessional thoughts and acts  - sertraline (ZOLOFT) 50 MG tablet; Take 1 tablet (50 mg total) by  mouth daily.  Dispense: 90 tablet; Refill: 1  Continue same medication regimen. Follow up in 3 months.   Kristin Crane, MD 10/17/2019, 8:36 AM

## 2020-01-13 ENCOUNTER — Encounter (HOSPITAL_COMMUNITY): Payer: Self-pay | Admitting: Psychiatry

## 2020-01-13 ENCOUNTER — Telehealth (INDEPENDENT_AMBULATORY_CARE_PROVIDER_SITE_OTHER): Payer: BC Managed Care – PPO | Admitting: Psychiatry

## 2020-01-13 ENCOUNTER — Ambulatory Visit: Payer: BC Managed Care – PPO | Admitting: Psychiatry

## 2020-01-13 ENCOUNTER — Other Ambulatory Visit: Payer: Self-pay

## 2020-01-13 ENCOUNTER — Telehealth (HOSPITAL_COMMUNITY): Payer: Self-pay | Admitting: Psychiatry

## 2020-01-13 DIAGNOSIS — F411 Generalized anxiety disorder: Secondary | ICD-10-CM

## 2020-01-13 DIAGNOSIS — F422 Mixed obsessional thoughts and acts: Secondary | ICD-10-CM | POA: Diagnosis not present

## 2020-01-13 MED ORDER — SERTRALINE HCL 50 MG PO TABS: 50.0000 mg | ORAL_TABLET | Freq: Every day | ORAL | 1 refills | Status: DC

## 2020-01-13 NOTE — Progress Notes (Signed)
BH MD OP Progress Note  Virtual Visit via Video Note  I connected with Kristin Edwards on 01/13/20 at  8:30 AM EDT by a video enabled telemedicine application and verified that I am speaking with the correct person using two identifiers.  Location: Patient: Home Provider: Clinic   I discussed the limitations of evaluation and management by telemedicine and the availability of in person appointments. The patient expressed understanding and agreed to proceed.  I provided 18 minutes of non-face-to-face time during this encounter.     01/13/2020 8:41 AM Kristin Edwards  MRN:  [id number]  Chief Complaint: "  I am not as stressed as I was but I still keep picking on my skin on my hands."  HPI: Patient informed that she is not under much stress that she was a couple months ago.  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  She informed that overall things are going well and her anxiety is well controlled with help of sertraline 50 mg.  However she still has days when she has the urge to pick on her skin repeatedly and her family has to remind her to stop from doing so.  She stated that she has no desire to continue the habit and is trying hard to get rid of this urge. She was offered an increase in the dose of sertraline to address that however she stated that she would like to keep the same dose of the medication and try other options.  She was informed regarding habit reversal therapy.  She resides in [place] area which is likely to have good resources/therapist that may offer this therapy.  Patient was agreeable to try it.  Visit Diagnosis:    ICD-10-CM   1. Generalized anxiety disorder  F41.1   2. Mixed obsessional thoughts and acts  F42.2     Past Psychiatric History: GAD  Past Medical History:  Past Medical History:  Diagnosis Date  . Exercise-induced asthma    No past surgical history on file.  Family Psychiatric History:  Mom and grandmother- anxiety  Family History:  Family History  Problem Relation Age of Onset  . Heart attack Maternal Grandfather   . Breast cancer Paternal Grandmother     Social History:  Social History   Socioeconomic History  . Marital status: Married    Spouse name: Not on file  . Number of children: Not on file  . Years of education: Not on file  . Highest education level: Not on file  Occupational History  . Not on file  Tobacco Use  . Smoking status: Never Smoker  . Smokeless tobacco: Never Used  Vaping Use  . Vaping Use: Never used  Substance and Sexual Activity  . Alcohol use: Yes    Comment: occasionally, "2 per week"   . Drug use: No  . Sexual activity: Yes    Birth control/protection: I.U.D.    Comment: mirena  Other Topics Concern  . Not on file  Social History Narrative  . Not on file   Social Determinants of Health   Financial Resource Strain:   . Difficulty of Paying Living Expenses:   Food Insecurity:   . Worried About Programme researcher, broadcasting/film/video in the Last Year:   . Barista in the Last Year:   Transportation Needs:   . Freight forwarder (Medical):   Marland Kitchen Lack of Transportation (Non-Medical):   Physical Activity:   .  Days of Exercise per Week:   . Minutes of Exercise per Session:   Stress:   . Feeling of Stress :   Social Connections:   . Frequency of Communication with Friends and Family:   . Frequency of Social Gatherings with Friends and Family:   . Attends Religious Services:   . Active Member of Clubs or Organizations:   . Attends Banker Meetings:   Marland Kitchen Marital Status:     Allergies:  Allergies  Allergen Reactions  . Penicillins Hives    Metabolic Disorder Labs: Lab Results  Component Value Date   HGBA1C 4.9 12/19/2016   No results found for: PROLACTIN Lab Results  Component Value Date   CHOL 148 12/19/2016   TRIG 60 12/19/2016   HDL 45 12/19/2016   CHOLHDL 3.3 12/19/2016   LDLCALC 91 12/19/2016    Lab Results  Component Value Date   TSH 3.500 12/19/2016    Therapeutic Level Labs: No results found for: LITHIUM No results found for: VALPROATE No components found for:  CBMZ  Current Medications: Current Outpatient Medications  Medication Sig Dispense Refill  . albuterol (VENTOLIN HFA) 108 (90 Base) MCG/ACT inhaler Inhale 2 puffs into the lungs every 6 (six) hours as needed for wheezing or shortness of breath. Please give ventolin 1 g 0  . cetirizine (ZYRTEC) 10 MG tablet Take 10 mg by mouth as needed.    Marland Kitchen levofloxacin (LEVAQUIN) 500 MG tablet Take 1 tablet (500 mg total) by mouth daily. (Patient not taking: Reported on 02/21/2018) 7 tablet 0  . levonorgestrel (MIRENA) 20 MCG/24HR IUD 20 Intra Uterine Devices by Intrauterine route once.    . nystatin ointment (MYCOSTATIN) Apply topically.    . sertraline (ZOLOFT) 50 MG tablet Take 1 tablet (50 mg total) by mouth daily. 90 tablet 1  . triamcinolone ointment (KENALOG) 0.1 % MIX WITH NYSTATIN AND APPLY TO CORNER OF MOUTH TWICE DAILY UNTIL HEALED     No current facility-administered medications for this visit.     Musculoskeletal: Strength & Muscle Tone: unable to assess due to telemed visit Gait & Station: unable to assess due to telemed visit Patient leans: unable to assess due to telemed visit  Psychiatric Specialty Exam: Review of Systems  There were no vitals taken for this visit.There is no height or weight on file to calculate BMI.  General Appearance: Well Groomed  Eye Contact:  Good  Speech:  Clear and Coherent and Normal Rate  Volume:  Normal  Mood:  Euthymic  Affect:  Congruent  Thought Process:  Goal Directed, Linear and Descriptions of Associations: Intact  Orientation:  Full (Time, Place, and Person)  Thought Content: Logical   Suicidal Thoughts:  No  Homicidal Thoughts:  No  Memory:  Recent;   Good Remote;   Good  Judgement:  Good  Insight:  Good  Psychomotor Activity:  Normal  Concentration:   Concentration: Good and Attention Span: Good  Recall:  Good  Fund of Knowledge: Good  Language: Good  Akathisia:  Negative  Handed:  Right  AIMS (if indicated): not done  Assets:  Communication Skills Desire for Improvement Financial Resources/Insurance Housing Social Support Talents/Skills  ADL's:  Intact  Cognition: WNL  Sleep:  Good     Assessment and Plan: Patient reported continued healing of skin on her hands although her anxiety is well controlled with help of Zoloft.  She was informed regarding habit versus therapy as a treatment option and was encouraged to contact her therapist  who offers this in her area.   1. Generalized anxiety disorder  - sertraline (ZOLOFT) 50 MG tablet; Take 1 tablet (50 mg total) by mouth daily.  Dispense: 90 tablet; Refill: 1  2. Mixed obsessional thoughts and acts  - sertraline (ZOLOFT) 50 MG tablet; Take 1 tablet (50 mg total) by mouth daily.  Dispense: 90 tablet; Refill: 1  Continue same medication regimen. Follow up in 3 months.   Zena Amos, MD 01/13/2020, 8:41 AM

## 2020-01-29 ENCOUNTER — Ambulatory Visit: Payer: Self-pay | Admitting: Nurse Practitioner

## 2020-01-29 ENCOUNTER — Other Ambulatory Visit: Payer: Self-pay

## 2020-01-29 ENCOUNTER — Encounter: Payer: Self-pay | Admitting: Nurse Practitioner

## 2020-01-29 VITALS — BP 148/66 | HR 98 | Temp 96.9°F | Wt 249.4 lb

## 2020-01-29 DIAGNOSIS — B3731 Acute candidiasis of vulva and vagina: Secondary | ICD-10-CM

## 2020-01-29 DIAGNOSIS — L089 Local infection of the skin and subcutaneous tissue, unspecified: Secondary | ICD-10-CM

## 2020-01-29 MED ORDER — FLUCONAZOLE 150 MG PO TABS: 150.0000 mg | ORAL_TABLET | Freq: Once | ORAL | 0 refills | Status: AC

## 2020-01-29 MED ORDER — SULFAMETHOXAZOLE-TRIMETHOPRIM 800-160 MG PO TABS: 1.0000 | ORAL_TABLET | Freq: Two times a day (BID) | ORAL | 0 refills | Status: AC

## 2020-01-29 NOTE — Patient Instructions (Signed)
Paronychia Paronychia is an infection of the skin. It happens near a fingernail or toenail. It may cause pain and swelling around the nail. In some cases, a fluid-filled bump (abscess) can form near or under the nail. Usually, this condition is not serious, and it clears up with treatment. Follow these instructions at home: Wound care  Keep the affected area clean.  Soak the fingers or toes in warm water as told by your doctor. You may be told to do this for 20 minutes, 2-3 times a day.  Keep the area dry when you are not soaking it.  Do not try to drain a fluid-filled bump on your own.  Follow instructions from your doctor about how to take care of the affected area. Make sure you: ? Wash your hands with soap and water before you change your bandage (dressing). If you cannot use soap and water, use hand sanitizer. ? Change your bandage as told by your doctor.  If you had a fluid-filled bump and your doctor drained it, check the area every day for signs of infection. Check for: ? Redness, swelling, or pain. ? Fluid or blood. ? Warmth. ? Pus or a bad smell. Medicines   Take over-the-counter and prescription medicines only as told by your doctor.  If you were prescribed an antibiotic medicine, take it as told by your doctor. Do not stop taking it even if you start to feel better. General instructions  Avoid touching any chemicals.  Do not pick at the affected area. Prevention  To prevent this condition from happening again: ? Wear rubber gloves when putting your hands in water for washing dishes or other tasks. ? Wear gloves if your hands might touch cleaners or chemicals. ? Avoid injuring your nails or fingertips. ? Do not bite your nails or tear hangnails. ? Do not cut your nails very short. ? Do not cut the skin at the base and sides of the nail (cuticles). ? Use clean nail clippers or scissors when trimming nails. Contact a doctor if:  You feel worse.  You do not get  better.  You have more fluid, blood, or pus coming from the affected area.  Your finger or knuckle is swollen or is hard to move. Get help right away if you have:  A fever or chills.  Redness spreading from the affected area.  Pain in a joint or muscle. Summary  Paronychia is an infection of the skin. It happens near a fingernail or toenail.  This condition may cause pain and swelling around the nail.  Soak the fingers or toes in warm water as told by your doctor.  Usually, this condition is not serious, and it clears up with treatment. This information is not intended to replace advice given to you by your health care provider. Make sure you discuss any questions you have with your health care provider. Document Revised: 07/06/2017 Document Reviewed: 07/02/2017 Elsevier Patient Education  2020 [employer]vier Inc[employer]  

## 2020-01-29 NOTE — Progress Notes (Signed)
   Subjective:    Patient ID: Kristin Edwards, female    DOB: 08-15-1990, 29 y.o.   MRN: [id number]  HPI She was giving herslef a pedicure about a week ago and stuck herself several times with her cuticle stick. Her toes have been hurting her since mainly right big toe and left 2nd toe/digit.  She noted some more swelling to the skin surrounding nailbed today. She notes that she is prone to skin infections. Has a PCN allergy but can tolerate Bactrim. Does routinely get yeast infections secondary to abx use.   Today's Vitals   01/29/20 1453  BP: (!) 148/66  Pulse: 98  Temp: (!) 96.9 F (36.1 C)  SpO2: 99%  Weight: (!) 249 lb 6.4 oz (113.1 kg)   Body mass index is 40.25 kg/m. Review of Systems  Constitutional: Negative.   Skin: Positive for color change and wound.    Current Outpatient Medications:  .  albuterol (VENTOLIN HFA) 108 (90 Base) MCG/ACT inhaler, Inhale 2 puffs into the lungs every 6 (six) hours as needed for wheezing or shortness of breath. Please give ventolin, Disp: 1 g, Rfl: 0 .  cetirizine (ZYRTEC) 10 MG tablet, Take 10 mg by mouth as needed., Disp: , Rfl:  .  fluconazole (DIFLUCAN) 150 MG tablet, Take 1 tablet (150 mg total) by mouth once for 1 dose., Disp: 2 tablet, Rfl: 0 .  levonorgestrel (MIRENA) 20 MCG/24HR IUD, 20 Intra Uterine Devices by Intrauterine route once., Disp: , Rfl:  .  nystatin ointment (MYCOSTATIN), Apply topically. (Patient not taking: Reported on 01/29/2020), Disp: , Rfl:  .  sertraline (ZOLOFT) 50 MG tablet, Take 1 tablet (50 mg total) by mouth daily., Disp: 90 tablet, Rfl: 1 .  sulfamethoxazole-trimethoprim (BACTRIM DS) 800-160 MG tablet, Take 1 tablet by mouth 2 (two) times daily for 10 days., Disp: 20 tablet, Rfl: 0 .  triamcinolone ointment (KENALOG) 0.1 %, MIX WITH NYSTATIN AND APPLY TO CORNER OF MOUTH TWICE DAILY UNTIL HEALED (Patient not taking: Reported on 01/29/2020), Disp: , Rfl:     Objective:   Physical Exam Constitutional:      Appearance:  Normal appearance.  Musculoskeletal:       Feet:  Feet:     Comments: Distal circulation and sensation intact to toes bilaterally. Irritation to medial aspect of right big toe/first digit and left second digit. No active drainage. Skin just distal to nail erythematous and swollen Neurological:     Mental Status: She is alert and oriented to person, place, and time.          Assessment & Plan:  Instructed patient to perform Warm water Epson Salt soaks 1-2 times daily for feet.   May continue to use topical antibiotic ointment to help soften area.   Wear open toe shoes as much as possible to prevent pressure/rubbing and irritation to area.   Start Bactrim oral twice daily for ten days.   May use Diflucan Rx as needed for vaginal yeast symptoms.   RTC if symptoms persist or with new concerns as discussed.

## 2020-03-08 ENCOUNTER — Encounter: Payer: Self-pay | Admitting: Medical

## 2020-03-08 ENCOUNTER — Telehealth: Payer: Self-pay | Admitting: Medical

## 2020-03-08 ENCOUNTER — Other Ambulatory Visit: Payer: Self-pay

## 2020-03-08 ENCOUNTER — Ambulatory Visit: Payer: Self-pay | Admitting: *Deleted

## 2020-03-08 DIAGNOSIS — R059 Cough, unspecified: Secondary | ICD-10-CM

## 2020-03-08 DIAGNOSIS — B3731 Acute candidiasis of vulva and vagina: Secondary | ICD-10-CM

## 2020-03-08 DIAGNOSIS — Z20822 Contact with and (suspected) exposure to covid-19: Secondary | ICD-10-CM

## 2020-03-08 DIAGNOSIS — J069 Acute upper respiratory infection, unspecified: Secondary | ICD-10-CM

## 2020-03-08 LAB — POC COVID19 BINAXNOW: SARS Coronavirus 2 Ag: NEGATIVE

## 2020-03-08 MED ORDER — BENZONATATE 100 MG PO CAPS: ORAL_CAPSULE | ORAL | 0 refills | Status: AC

## 2020-03-08 MED ORDER — AZITHROMYCIN 250 MG PO TABS: ORAL_TABLET | ORAL | 0 refills | Status: AC

## 2020-03-08 MED ORDER — FLUCONAZOLE 150 MG PO TABS: 150.0000 mg | ORAL_TABLET | Freq: Every day | ORAL | 0 refills | Status: AC

## 2020-03-08 NOTE — Progress Notes (Signed)
   Subjective:    Patient ID: Kristin Edwards, female    DOB: 03-25-1991, 29 y.o.   MRN: [id number]  HPI 29 yo female in non acute distress. Consents to telemedicine appointment. Started on Thursday with a Headache, took sudafed and headache resolved and coughing started , non productive, dry. No fever or chills, some SOB with exertion, no chest pain other then sore muscles from cough.  ST from coughing per patient. History of exercise induced Asthma, has Albuterol MDI    Has tried Delsym and Robitussin DM No known Covid exposure. Vaccinated  Moderna  Second vaccine April 2 , 2021.     Review of Systems  Constitutional: Negative for chills and fever.  HENT: Positive for sore throat (just coughing). Negative for congestion, postnasal drip and rhinorrhea.   Respiratory: Positive for cough (dry hx of exercised induced asthma) and shortness of breath (with walking and increases coughing). Negative for chest tightness and wheezing.   Cardiovascular: Negative for chest pain.  Gastrointestinal: Negative for abdominal pain, diarrhea, nausea and vomiting.  Genitourinary: Negative for difficulty urinating.  Musculoskeletal: Negative for myalgias.  Allergic/Immunologic: Positive for environmental allergies (ragweed, though she states current symptoms do no feel like allergies.). Food allergies: ragweed.  Neurological: Positive for headaches. Negative for dizziness, syncope and light-headedness.       Objective:   Physical Exam  AXOX3 No physical exam performed due to telemedicine appointment. Able to speak in complete sentences Cough noted on phone call. POC Covid-19 Negative Pending  PCR Covid-19    Assessment & Plan:  Cough Viral upper respiratory infection Vulvovaginal candidiasis  Covid-19 PCR test pending. Meds ordered this encounter  Medications  . benzonatate (TESSALON PERLES) 100 MG capsule    Sig: Take  2 capsules every 8 hours for cough.    Dispense:  30 capsule    Refill:   0  . azithromycin (ZITHROMAX) 250 MG tablet    Sig: Take  2 tablets by mouth today then one tablet days  2-5, take with food.    Dispense:  6 tablet    Refill:  0  . fluconazole (DIFLUCAN) 150 MG tablet    Sig: Take 1 tablet (150 mg total) by mouth daily.    Dispense:  1 tablet    Refill:  0  Continue Zyrtec. Try OTC Flonase per package instructions.. Use Albuterol  Inhaler  2-3 times / day as to help with Bronchospasm. Rest and increase fluids, take OTC Motrin or Tylenol as directed on the package for Chest soreness or fever. Patient verbalizes understanding and has no questions at the end of our conversation.

## 2020-03-10 ENCOUNTER — Telehealth: Payer: Self-pay | Admitting: Medical

## 2020-03-10 NOTE — Telephone Encounter (Signed)
Given patient work note from 03-08-20 thru 03-12-20 with reassessment on  03-15-20 virtually if not improving. Patient on Z-pak, Albuterol MDI, Benzonatate perles. Still coughing and dong tea with honey. Cough noted on phone call. Patient states she is not wheezing , and she does get SOB with going upstairs, but not at rest. Continue to monitor symptoms if SOB worsens you may need to go the Emergency Department of the closest hospital to you. ( she lives 45 min away from [place]).

## 2020-03-12 LAB — NOVEL CORONAVIRUS, NAA: SARS-CoV-2, NAA: NOT DETECTED

## 2020-03-15 ENCOUNTER — Telehealth: Payer: Self-pay | Admitting: Medical

## 2020-03-15 DIAGNOSIS — R059 Cough, unspecified: Secondary | ICD-10-CM

## 2020-03-15 NOTE — Telephone Encounter (Signed)
Called patient, she states she is feeling completely well except for a mild cough in the morning. She return to work today. Return to the clinic as needed. She verbalizes understanding and has no questions at the end of our conversation.

## 2020-04-08 ENCOUNTER — Encounter (HOSPITAL_COMMUNITY): Payer: Self-pay | Admitting: Psychiatry

## 2020-04-08 ENCOUNTER — Telehealth (INDEPENDENT_AMBULATORY_CARE_PROVIDER_SITE_OTHER): Payer: BC Managed Care – PPO | Admitting: Psychiatry

## 2020-04-08 ENCOUNTER — Other Ambulatory Visit: Payer: Self-pay

## 2020-04-08 DIAGNOSIS — F422 Mixed obsessional thoughts and acts: Secondary | ICD-10-CM

## 2020-04-08 DIAGNOSIS — F411 Generalized anxiety disorder: Secondary | ICD-10-CM | POA: Diagnosis not present

## 2020-04-08 MED ORDER — SERTRALINE HCL 50 MG PO TABS: 50.0000 mg | ORAL_TABLET | Freq: Every day | ORAL | 1 refills | Status: DC

## 2020-04-08 NOTE — Progress Notes (Signed)
BH MD OP Progress Note  Virtual Visit via Video Note  I connected with Kristin Edwards on 04/08/20 at  8:40 AM EDT by a video enabled telemedicine application and verified that I am speaking with the correct person using two identifiers.  Location: Patient: Home Provider: Clinic   I discussed the limitations of evaluation and management by telemedicine and the availability of in person appointments. The patient expressed understanding and agreed to proceed.  I provided 15 minutes of non-face-to-face time during this encounter.     04/08/2020 8:42 AM Kristin Edwards  MRN:  [id number]  Chief Complaint: "  I am doing better."  HPI: Patient informed that she is doing better.  She stated that she has not been peeling the skin on her hands frequently as she was.  She stated this indicates that she has been less anxious than usual.  She informed that overall things are going well at work and home.  She did look into the prospect of starting therapy with a specialist that specializes in habit reversal therapy.  She is signing up for new insurance benefits and is going to take that into consideration. She informed that usually the holidays are stressful time.  For her and she is hoping that she will have a good holiday season this year.  Visit Diagnosis:    ICD-10-CM   1. Generalized anxiety disorder  F41.1 sertraline (ZOLOFT) 50 MG tablet    DISCONTINUED: sertraline (ZOLOFT) 50 MG tablet  2. Mixed obsessional thoughts and acts  F42.2 sertraline (ZOLOFT) 50 MG tablet    DISCONTINUED: sertraline (ZOLOFT) 50 MG tablet    Past Psychiatric History: GAD  Past Medical History:  Past Medical History:  Diagnosis Date  . Exercise-induced asthma    No past surgical history on file.  Family Psychiatric History: Mom and grandmother- anxiety  Family History:  Family History  Problem Relation Age of Onset  . Heart attack Maternal Grandfather   . Breast cancer Paternal Grandmother     Social  History:  Social History   Socioeconomic History  . Marital status: Married    Spouse name: Not on file  . Number of children: Not on file  . Years of education: Not on file  . Highest education level: Not on file  Occupational History  . Not on file  Tobacco Use  . Smoking status: Never Smoker  . Smokeless tobacco: Never Used  Vaping Use  . Vaping Use: Never used  Substance and Sexual Activity  . Alcohol use: Yes    Comment: occasionally, "2 per week"   . Drug use: No  . Sexual activity: Yes    Birth control/protection: I.U.D.    Comment: mirena  Other Topics Concern  . Not on file  Social History Narrative  . Not on file   Social Determinants of Health   Financial Resource Strain:   . Difficulty of Paying Living Expenses: Not on file  Food Insecurity:   . Worried About Programme researcher, broadcasting/film/video in the Last Year: Not on file  . Ran Out of Food in the Last Year: Not on file  Transportation Needs:   . Lack of Transportation (Medical): Not on file  . Lack of Transportation (Non-Medical): Not on file  Physical Activity:   . Days of Exercise per Week: Not on file  . Minutes of Exercise per Session: Not on file  Stress:   . Feeling of Stress : Not on file  Social Connections:   . Frequency  of Communication with Friends and Family: Not on file  . Frequency of Social Gatherings with Friends and Family: Not on file  . Attends Religious Services: Not on file  . Active Member of Clubs or Organizations: Not on file  . Attends Banker Meetings: Not on file  . Marital Status: Not on file    Allergies:  Allergies  Allergen Reactions  . Penicillins Hives    Metabolic Disorder Labs: Lab Results  Component Value Date   HGBA1C 4.9 12/19/2016   No results found for: PROLACTIN Lab Results  Component Value Date   CHOL 148 12/19/2016   TRIG 60 12/19/2016   HDL 45 12/19/2016   CHOLHDL 3.3 12/19/2016   LDLCALC 91 12/19/2016   Lab Results  Component Value Date    TSH 3.500 12/19/2016    Therapeutic Level Labs: No results found for: LITHIUM No results found for: VALPROATE No components found for:  CBMZ  Current Medications: Current Outpatient Medications  Medication Sig Dispense Refill  . albuterol (VENTOLIN HFA) 108 (90 Base) MCG/ACT inhaler Inhale 2 puffs into the lungs every 6 (six) hours as needed for wheezing or shortness of breath. Please give ventolin 1 g 0  . azithromycin (ZITHROMAX) 250 MG tablet Take  2 tablets by mouth today then one tablet days  2-5, take with food. 6 tablet 0  . benzonatate (TESSALON PERLES) 100 MG capsule Take  2 capsules every 8 hours for cough. 30 capsule 0  . cetirizine (ZYRTEC) 10 MG tablet Take 10 mg by mouth as needed.    . fluconazole (DIFLUCAN) 150 MG tablet Take 1 tablet (150 mg total) by mouth daily. 1 tablet 0  . levonorgestrel (MIRENA) 20 MCG/24HR IUD 20 Intra Uterine Devices by Intrauterine route once.    . nystatin ointment (MYCOSTATIN) Apply topically. (Patient not taking: Reported on 01/29/2020)    . sertraline (ZOLOFT) 50 MG tablet Take 1 tablet (50 mg total) by mouth daily. 90 tablet 1  . triamcinolone ointment (KENALOG) 0.1 % MIX WITH NYSTATIN AND APPLY TO CORNER OF MOUTH TWICE DAILY UNTIL HEALED (Patient not taking: Reported on 01/29/2020)     No current facility-administered medications for this visit.     Musculoskeletal: Strength & Muscle Tone: unable to assess due to telemed visit Gait & Station: unable to assess due to telemed visit Patient leans: unable to assess due to telemed visit  Psychiatric Specialty Exam: Review of Systems  There were no vitals taken for this visit.There is no height or weight on file to calculate BMI.  General Appearance: Well Groomed  Eye Contact:  Good  Speech:  Clear and Coherent and Normal Rate  Volume:  Normal  Mood:  Euthymic  Affect:  Congruent  Thought Process:  Goal Directed, Linear and Descriptions of Associations: Intact  Orientation:  Full  (Time, Place, and Person)  Thought Content: Logical   Suicidal Thoughts:  No  Homicidal Thoughts:  No  Memory:  Recent;   Good Remote;   Good  Judgement:  Good  Insight:  Good  Psychomotor Activity:  Normal  Concentration:  Concentration: Good and Attention Span: Good  Recall:  Good  Fund of Knowledge: Good  Language: Good  Akathisia:  Negative  Handed:  Right  AIMS (if indicated): not done  Assets:  Communication Skills Desire for Improvement Financial Resources/Insurance Housing Social Support Talents/Skills  ADL's:  Intact  Cognition: WNL  Sleep:  Good     Assessment and Plan: Patient reported some improvement in  her anxiety and frequent skin peeling habit.  She would like to continue same regimen for now.  1. Generalized anxiety disorder  - sertraline (ZOLOFT) 50 MG tablet; Take 1 tablet (50 mg total) by mouth daily.  Dispense: 90 tablet; Refill: 1  2. Mixed obsessional thoughts and acts  - sertraline (ZOLOFT) 50 MG tablet; Take 1 tablet (50 mg total) by mouth daily.  Dispense: 90 tablet; Refill: 1  Continue same medication regimen. Follow up in 3 months.   Zena Amos, MD 04/08/2020, 8:42 AM

## 2020-07-16 ENCOUNTER — Telehealth (HOSPITAL_COMMUNITY): Payer: BC Managed Care – PPO | Admitting: Psychiatry

## 2020-08-25 ENCOUNTER — Encounter (HOSPITAL_COMMUNITY): Payer: Self-pay | Admitting: Psychiatry

## 2020-08-25 ENCOUNTER — Other Ambulatory Visit: Payer: Self-pay

## 2020-08-25 ENCOUNTER — Telehealth (INDEPENDENT_AMBULATORY_CARE_PROVIDER_SITE_OTHER): Payer: BC Managed Care – PPO | Admitting: Psychiatry

## 2020-08-25 DIAGNOSIS — F411 Generalized anxiety disorder: Secondary | ICD-10-CM | POA: Diagnosis not present

## 2020-08-25 DIAGNOSIS — F422 Mixed obsessional thoughts and acts: Secondary | ICD-10-CM | POA: Diagnosis not present

## 2020-08-25 MED ORDER — SERTRALINE HCL 50 MG PO TABS: 50.0000 mg | ORAL_TABLET | Freq: Every day | ORAL | 1 refills | Status: DC

## 2020-08-25 NOTE — Progress Notes (Signed)
BH MD OP Progress Note  Virtual Visit via Video Note  I connected with Kristin Edwards on 08/25/20 at 10:00 AM EST by a video enabled telemedicine application and verified that I am speaking with the correct person using two identifiers.  Location: Patient: Home Provider: Clinic   I discussed the limitations of evaluation and management by telemedicine and the availability of in person appointments. The patient expressed understanding and agreed to proceed.  I provided 15 minutes of non-face-to-face time during this encounter.     08/25/2020 10:02 AM Kristin Edwards  MRN:  [id number]  Chief Complaint: "I am doing well."  HPI: Patient reported that she has been doing well for the most part.  She stated that she has been under a lot of stress lately and she is in the process of looking for a new job.  However despite the stress she has handled things fairly well.  Her mood has been stable and she has not been very anxious. Regarding her peeling her skin, she stated that given the circumstances she has done really well and has not gone back to peeling or picking on her skin. She has been able to sleep well. She stated that she is happy that she is on this medication at the current dose and would like to keep things the way they are for now.   Visit Diagnosis:  No diagnosis found.  Past Psychiatric History: GAD  Past Medical History:  Past Medical History:  Diagnosis Date  . Exercise-induced asthma    No past surgical history on file.  Family Psychiatric History: Mom and grandmother- anxiety  Family History:  Family History  Problem Relation Age of Onset  . Heart attack Maternal Grandfather   . Breast cancer Paternal Grandmother     Social History:  Social History   Socioeconomic History  . Marital status: Married    Spouse name: Not on file  . Number of children: Not on file  . Years of education: Not on file  . Highest education level: Not on file  Occupational History   . Not on file  Tobacco Use  . Smoking status: Never Smoker  . Smokeless tobacco: Never Used  Vaping Use  . Vaping Use: Never used  Substance and Sexual Activity  . Alcohol use: Yes    Comment: occasionally, "2 per week"   . Drug use: No  . Sexual activity: Yes    Birth control/protection: I.U.D.    Comment: mirena  Other Topics Concern  . Not on file  Social History Narrative  . Not on file   Social Determinants of Health   Financial Resource Strain: Not on file  Food Insecurity: Not on file  Transportation Needs: Not on file  Physical Activity: Not on file  Stress: Not on file  Social Connections: Not on file    Allergies:  Allergies  Allergen Reactions  . Penicillins Hives    Metabolic Disorder Labs: Lab Results  Component Value Date   HGBA1C 4.9 12/19/2016   No results found for: PROLACTIN Lab Results  Component Value Date   CHOL 148 12/19/2016   TRIG 60 12/19/2016   HDL 45 12/19/2016   CHOLHDL 3.3 12/19/2016   LDLCALC 91 12/19/2016   Lab Results  Component Value Date   TSH 3.500 12/19/2016    Therapeutic Level Labs: No results found for: LITHIUM No results found for: VALPROATE No components found for:  CBMZ  Current Medications: Current Outpatient Medications  Medication Sig Dispense Refill  .  albuterol (VENTOLIN HFA) 108 (90 Base) MCG/ACT inhaler Inhale 2 puffs into the lungs every 6 (six) hours as needed for wheezing or shortness of breath. Please give ventolin 1 g 0  . azithromycin (ZITHROMAX) 250 MG tablet Take  2 tablets by mouth today then one tablet days  2-5, take with food. 6 tablet 0  . benzonatate (TESSALON PERLES) 100 MG capsule Take  2 capsules every 8 hours for cough. 30 capsule 0  . cetirizine (ZYRTEC) 10 MG tablet Take 10 mg by mouth as needed.    . fluconazole (DIFLUCAN) 150 MG tablet Take 1 tablet (150 mg total) by mouth daily. 1 tablet 0  . levonorgestrel (MIRENA) 20 MCG/24HR IUD 20 Intra Uterine Devices by Intrauterine route  once.    . nystatin ointment (MYCOSTATIN) Apply topically. (Patient not taking: Reported on 01/29/2020)    . sertraline (ZOLOFT) 50 MG tablet Take 1 tablet (50 mg total) by mouth daily. 90 tablet 1  . triamcinolone ointment (KENALOG) 0.1 % MIX WITH NYSTATIN AND APPLY TO CORNER OF MOUTH TWICE DAILY UNTIL HEALED (Patient not taking: Reported on 01/29/2020)     No current facility-administered medications for this visit.     Musculoskeletal: Strength & Muscle Tone: unable to assess due to telemed visit Gait & Station: unable to assess due to telemed visit Patient leans: unable to assess due to telemed visit  Psychiatric Specialty Exam: Review of Systems  There were no vitals taken for this visit.There is no height or weight on file to calculate BMI.  General Appearance: Fairly Groomed  Eye Contact:  Good  Speech:  Clear and Coherent and Normal Rate  Volume:  Normal  Mood:  Euthymic  Affect:  Congruent  Thought Process:  Goal Directed, Linear and Descriptions of Associations: Intact  Orientation:  Full (Time, Place, and Person)  Thought Content: Logical   Suicidal Thoughts:  No  Homicidal Thoughts:  No  Memory:  Recent;   Good Remote;   Good  Judgement:  Good  Insight:  Good  Psychomotor Activity:  Normal  Concentration:  Concentration: Good and Attention Span: Good  Recall:  Good  Fund of Knowledge: Good  Language: Good  Akathisia:  Negative  Handed:  Right  AIMS (if indicated): not done  Assets:  Communication Skills Desire for Improvement Financial Resources/Insurance Housing Social Support Talents/Skills  ADL's:  Intact  Cognition: WNL  Sleep:  Good     Assessment and Plan: Patient seems to be stable on her current regimen.  1. Generalized anxiety disorder  - sertraline (ZOLOFT) 50 MG tablet; Take 1 tablet (50 mg total) by mouth daily.  Dispense: 90 tablet; Refill: 1  2. Mixed obsessional thoughts and acts  - sertraline (ZOLOFT) 50 MG tablet; Take 1 tablet  (50 mg total) by mouth daily.  Dispense: 90 tablet; Refill: 1  Continue same medication regimen. Follow up in 3 months.   Zena Amos, MD 08/25/2020, 10:02 AM

## 2020-11-18 ENCOUNTER — Telehealth (HOSPITAL_COMMUNITY): Payer: Self-pay | Admitting: Psychiatry

## 2020-11-18 DIAGNOSIS — F422 Mixed obsessional thoughts and acts: Secondary | ICD-10-CM

## 2020-11-18 DIAGNOSIS — F411 Generalized anxiety disorder: Secondary | ICD-10-CM

## 2020-11-18 MED ORDER — SERTRALINE HCL 50 MG PO TABS: 50.0000 mg | ORAL_TABLET | Freq: Every day | ORAL | 0 refills | Status: AC

## 2020-11-18 NOTE — Telephone Encounter (Signed)
Patient has moved to [place], [place] and now has [employer] [facility] [employer]. Patient cancelling appointment for tomorrow 5/20; requested for provider to send a bridge refill of medications and send a referral for patient to have services closer to home.  

## 2020-11-18 NOTE — Telephone Encounter (Signed)
Patient has moved to [place], [place] and now has [facility]. Patient cancelling appointment for tomorrow 5/20; requested for provider to send a bridge refill of medications and send a referral for patient to have services closer to home.

## 2020-11-18 NOTE — Telephone Encounter (Signed)
Rx sent to pharmacy on file.

## 2020-11-19 ENCOUNTER — Telehealth (HOSPITAL_COMMUNITY): Payer: BC Managed Care – PPO | Admitting: Psychiatry
